# Patient Record
Sex: Female | Born: 1935 | Race: White | Hispanic: No | Marital: Married | Smoking: Never smoker
Health system: Southern US, Community
[De-identification: ages and names within clinical notes are randomized; demographics above are authoritative.]

## PROBLEM LIST (undated history)

## (undated) DIAGNOSIS — I10 Essential (primary) hypertension: Secondary | ICD-10-CM

## (undated) HISTORY — DX: Essential (primary) hypertension: I10

---

## 1997-12-24 ENCOUNTER — Other Ambulatory Visit: Admission: RE | Admit: 1997-12-24 | Discharge: 1997-12-24 | Payer: Self-pay | Admitting: *Deleted

## 1999-09-09 ENCOUNTER — Encounter: Payer: Self-pay | Admitting: *Deleted

## 1999-09-09 ENCOUNTER — Encounter: Admission: RE | Admit: 1999-09-09 | Discharge: 1999-09-09 | Payer: Self-pay | Admitting: *Deleted

## 2000-05-09 ENCOUNTER — Other Ambulatory Visit: Admission: RE | Admit: 2000-05-09 | Discharge: 2000-05-09 | Payer: Self-pay | Admitting: *Deleted

## 2001-04-04 ENCOUNTER — Encounter: Payer: Self-pay | Admitting: Internal Medicine

## 2001-04-04 ENCOUNTER — Encounter: Admission: RE | Admit: 2001-04-04 | Discharge: 2001-04-04 | Payer: Self-pay | Admitting: Internal Medicine

## 2002-03-15 ENCOUNTER — Other Ambulatory Visit: Admission: RE | Admit: 2002-03-15 | Discharge: 2002-03-15 | Payer: Self-pay | Admitting: Internal Medicine

## 2002-04-09 ENCOUNTER — Encounter: Payer: Self-pay | Admitting: Internal Medicine

## 2002-04-09 ENCOUNTER — Encounter: Admission: RE | Admit: 2002-04-09 | Discharge: 2002-04-09 | Payer: Self-pay | Admitting: Internal Medicine

## 2002-05-29 ENCOUNTER — Encounter (INDEPENDENT_AMBULATORY_CARE_PROVIDER_SITE_OTHER): Payer: Self-pay | Admitting: Specialist

## 2002-05-29 ENCOUNTER — Ambulatory Visit (HOSPITAL_COMMUNITY): Admission: RE | Admit: 2002-05-29 | Discharge: 2002-05-29 | Payer: Self-pay | Admitting: Gastroenterology

## 2003-05-01 ENCOUNTER — Encounter: Admission: RE | Admit: 2003-05-01 | Discharge: 2003-05-01 | Payer: Self-pay | Admitting: Internal Medicine

## 2003-05-01 ENCOUNTER — Encounter: Payer: Self-pay | Admitting: Internal Medicine

## 2004-05-19 ENCOUNTER — Encounter: Admission: RE | Admit: 2004-05-19 | Discharge: 2004-05-19 | Payer: Self-pay | Admitting: Internal Medicine

## 2005-06-02 ENCOUNTER — Encounter: Admission: RE | Admit: 2005-06-02 | Discharge: 2005-06-02 | Payer: Self-pay | Admitting: Internal Medicine

## 2006-11-19 ENCOUNTER — Emergency Department (HOSPITAL_COMMUNITY): Admission: EM | Admit: 2006-11-19 | Discharge: 2006-11-19 | Payer: Self-pay | Admitting: Emergency Medicine

## 2006-11-23 ENCOUNTER — Emergency Department (HOSPITAL_COMMUNITY): Admission: EM | Admit: 2006-11-23 | Discharge: 2006-11-23 | Payer: Self-pay | Admitting: Emergency Medicine

## 2007-05-09 ENCOUNTER — Encounter: Admission: RE | Admit: 2007-05-09 | Discharge: 2007-05-09 | Payer: Self-pay | Admitting: Internal Medicine

## 2009-11-05 ENCOUNTER — Emergency Department (HOSPITAL_COMMUNITY): Admission: EM | Admit: 2009-11-05 | Discharge: 2009-11-05 | Payer: Self-pay | Admitting: Emergency Medicine

## 2010-07-10 ENCOUNTER — Emergency Department (HOSPITAL_COMMUNITY): Admission: EM | Admit: 2010-07-10 | Discharge: 2010-07-11 | Payer: Self-pay | Admitting: Emergency Medicine

## 2010-09-16 ENCOUNTER — Emergency Department (HOSPITAL_COMMUNITY)
Admission: EM | Admit: 2010-09-16 | Discharge: 2010-09-16 | Payer: Self-pay | Source: Home / Self Care | Admitting: Emergency Medicine

## 2010-09-20 LAB — BASIC METABOLIC PANEL
BUN: 10 mg/dL (ref 6–23)
Chloride: 101 mEq/L (ref 96–112)
Creatinine, Ser: 0.69 mg/dL (ref 0.4–1.2)
GFR calc Af Amer: >60 mL/min (ref >60)
GFR calc non Af Amer: >60 mL/min (ref >60)
Potassium: 4.1 mEq/L (ref 3.5–5.1)

## 2010-09-20 LAB — DIFFERENTIAL
Basophils Relative: 0 % (ref 0–1)
Eosinophils Absolute: 0.1 10*3/uL (ref 0.0–0.7)
Eosinophils Relative: 1 % (ref 0–5)
Neutrophils Relative %: 73 % (ref 43–77)

## 2010-09-20 LAB — CBC
MCH: 28.3 pg (ref 26.0–34.0)
MCV: 83.5 fL (ref 78.0–100.0)
Platelets: 197 10*3/uL (ref 150–400)
RBC: 5.26 MIL/uL — ABNORMAL HIGH (ref 3.87–5.11)
RDW: 13.8 % (ref 11.5–15.5)
WBC: 7.7 10*3/uL (ref 4.0–10.5)

## 2010-11-09 LAB — CBC
Hemoglobin: 14.3 g/dL (ref 12.0–15.0)
Platelets: 185 10*3/uL (ref 150–400)
RBC: 4.99 MIL/uL (ref 3.87–5.11)
WBC: 10.1 10*3/uL (ref 4.0–10.5)

## 2010-11-09 LAB — DIFFERENTIAL
Lymphocytes Relative: 34 % (ref 12–46)
Lymphs Abs: 3.4 10*3/uL (ref 0.7–4.0)
Monocytes Relative: 6 % (ref 3–12)
Neutro Abs: 5.9 10*3/uL (ref 1.7–7.7)
Neutrophils Relative %: 58 % (ref 43–77)

## 2010-11-09 LAB — BASIC METABOLIC PANEL
CO2: 27 mEq/L (ref 19–32)
Calcium: 9.2 mg/dL (ref 8.4–10.5)
Creatinine, Ser: 0.89 mg/dL (ref 0.4–1.2)
GFR calc Af Amer: >60 mL/min (ref >60)
Sodium: 131 mEq/L — ABNORMAL LOW (ref 135–145)

## 2010-11-21 LAB — COMPREHENSIVE METABOLIC PANEL
ALT: 18 U/L (ref 0–35)
Albumin: 3.9 g/dL (ref 3.5–5.2)
BUN: 11 mg/dL (ref 6–23)
Calcium: 9.1 mg/dL (ref 8.4–10.5)
Glucose, Bld: 135 mg/dL — ABNORMAL HIGH (ref 70–99)
Sodium: 137 mEq/L (ref 135–145)
Total Protein: 7.1 g/dL (ref 6.0–8.3)

## 2010-11-21 LAB — URINALYSIS, ROUTINE W REFLEX MICROSCOPIC
Glucose, UA: NEGATIVE mg/dL
Hgb urine dipstick: NEGATIVE
Protein, ur: NEGATIVE mg/dL
Specific Gravity, Urine: 1.011 (ref 1.005–1.030)
pH: 6 (ref 5.0–8.0)

## 2010-11-21 LAB — URINE MICROSCOPIC-ADD ON

## 2010-11-21 LAB — CBC
HCT: 42.9 % (ref 36.0–46.0)
Platelets: 157 10*3/uL (ref 150–400)
RDW: 14.3 % (ref 11.5–15.5)

## 2010-11-21 LAB — DIFFERENTIAL
Lymphs Abs: 2.3 10*3/uL (ref 0.7–4.0)
Monocytes Absolute: 0.4 10*3/uL (ref 0.1–1.0)
Monocytes Relative: 6 % (ref 3–12)
Neutro Abs: 4.6 10*3/uL (ref 1.7–7.7)
Neutrophils Relative %: 61 % (ref 43–77)

## 2010-11-21 LAB — CK: Total CK: 27 U/L (ref 7–177)

## 2011-01-14 NOTE — Op Note (Signed)
   NAME:  Audrey Burke, Audrey Burke                     ACCOUNT NO.:  [id number]   MEDICAL RECORD NO.:  [id number]                   PATIENT TYPE:  AMB   LOCATION:  ENDO                                 FACILITY:  [facility]   PHYSICIAN:  Danise Edge, M.D.                DATE OF BIRTH:  [birth year]   DATE OF PROCEDURE:  05/29/2002  DATE OF DISCHARGE:                                 OPERATIVE REPORT   PROCEDURE:  Colonoscopy.   REFERRING PHYSICIAN:  Thora Lance, M.D.   PROCEDURE INDICATION:  The patient is a 77 year old female born 15-Sep-1931.  The patient is scheduled to undergo a diagnostic colonoscopy to  evaluate guaiac-positive stool.   ENDOSCOPIST:  Charolett Bumpers, M.D.   PREMEDICATION:  Versed 10 mg, Demerol 50 mg.   INSTRUMENT USED:  Olympus pediatric colonoscope.   PROCEDURE:  After obtaining informed consent, the patient was placed in the  left lateral decubitus position.  I administered intravenous Demerol and  intravenous Versed to achieve conscious sedation for the procedure.  The  patient's blood pressure, oxygen saturation, and cardiac rhythm were  monitored throughout the procedure and documented in the medical record.   Anal inspection was normal.  Digital rectal exam was normal.  The Olympus  pediatric video colonoscope was introduced into the rectum and easily  advanced to the cecum.  A normal-appearing ileocecal valve was intubated and  the distal ileum inspected.  Colonic preparation for the exam today was  excellent.   Rectum:  Normal.   Sigmoid colon and descending colon:  Normal.   Splenic flexure:  Normal.   Transverse colon:  Normal.   Hepatic flexure:  Normal.   Ascending colon:  A 3-mm sessile polyp was injected with saline and removed  with the electrocautery snare from the proximal ascending colon.  A 1-mm  sessile polyp was removed with the cold biopsy forceps from the mid  ascending colon.  Both polyps were submitted in one bottle  for pathological  evaluation.   Cecum and ileocecal valve:  Normal.   Distal ileum:  Normal.   ASSESSMENT:  From the ascending colon, a 3-mm polyp and 1-mm polyp were  removed; otherwise, normal proctocolonoscopy to the cecum.                                                  Danise Edge, M.D.    MJ/MEDQ  D:  05/29/2002  T:  05/29/2002  Job:  [id number]   cc:   Thora Lance, M.D.  [address] [place]  [place] [address]  Fax: [phone]

## 2011-04-18 ENCOUNTER — Inpatient Hospital Stay (HOSPITAL_COMMUNITY)
Admission: EM | Admit: 2011-04-18 | Discharge: 2011-04-21 | DRG: 308 | Disposition: A | Discharge status: Home Health | Payer: PRIVATE HEALTH INSURANCE | Attending: Internal Medicine | Admitting: Internal Medicine

## 2011-04-18 ENCOUNTER — Emergency Department (HOSPITAL_COMMUNITY): Payer: PRIVATE HEALTH INSURANCE

## 2011-04-18 DIAGNOSIS — R7309 Other abnormal glucose: Secondary | ICD-10-CM | POA: Diagnosis present

## 2011-04-18 DIAGNOSIS — M1990 Unspecified osteoarthritis, unspecified site: Secondary | ICD-10-CM | POA: Diagnosis present

## 2011-04-18 DIAGNOSIS — M79609 Pain in unspecified limb: Secondary | ICD-10-CM

## 2011-04-18 DIAGNOSIS — M545 Low back pain, unspecified: Secondary | ICD-10-CM | POA: Diagnosis present

## 2011-04-18 DIAGNOSIS — F411 Generalized anxiety disorder: Secondary | ICD-10-CM | POA: Diagnosis present

## 2011-04-18 DIAGNOSIS — E0590 Thyrotoxicosis, unspecified without thyrotoxic crisis or storm: Secondary | ICD-10-CM | POA: Diagnosis present

## 2011-04-18 DIAGNOSIS — E538 Deficiency of other specified B group vitamins: Secondary | ICD-10-CM | POA: Diagnosis present

## 2011-04-18 DIAGNOSIS — I509 Heart failure, unspecified: Secondary | ICD-10-CM | POA: Diagnosis present

## 2011-04-18 DIAGNOSIS — Z7901 Long term (current) use of anticoagulants: Secondary | ICD-10-CM

## 2011-04-18 DIAGNOSIS — I10 Essential (primary) hypertension: Secondary | ICD-10-CM | POA: Diagnosis present

## 2011-04-18 DIAGNOSIS — I5031 Acute diastolic (congestive) heart failure: Secondary | ICD-10-CM | POA: Diagnosis present

## 2011-04-18 DIAGNOSIS — I4891 Unspecified atrial fibrillation: Principal | ICD-10-CM | POA: Diagnosis present

## 2011-04-18 DIAGNOSIS — E669 Obesity, unspecified: Secondary | ICD-10-CM | POA: Diagnosis present

## 2011-04-18 DIAGNOSIS — G8929 Other chronic pain: Secondary | ICD-10-CM | POA: Diagnosis present

## 2011-04-18 LAB — CBC
HCT: 40.3 % (ref 36.0–46.0)
Hemoglobin: 13.1 g/dL (ref 12.0–15.0)
Hemoglobin: 13 g/dL (ref 12.0–15.0)
MCH: 26 pg (ref 26.0–34.0)
MCH: 26.1 pg (ref 26.0–34.0)
MCHC: 32.5 g/dL (ref 30.0–36.0)
MCV: 80 fL (ref 78.0–100.0)
Platelets: 164 10*3/uL (ref 150–400)
RBC: 4.99 MIL/uL (ref 3.87–5.11)
WBC: 7.1 10*3/uL (ref 4.0–10.5)

## 2011-04-18 LAB — DIFFERENTIAL
Lymphocytes Relative: 35 % (ref 12–46)
Monocytes Absolute: 0.5 10*3/uL (ref 0.1–1.0)
Monocytes Relative: 8 % (ref 3–12)
Neutro Abs: 3.1 10*3/uL (ref 1.7–7.7)

## 2011-04-18 LAB — COMPREHENSIVE METABOLIC PANEL
AST: 26 U/L (ref 0–37)
BUN: 13 mg/dL (ref 6–23)
CO2: 24 mEq/L (ref 19–32)
Chloride: 107 mEq/L (ref 96–112)
Creatinine, Ser: 0.54 mg/dL (ref 0.50–1.10)
GFR calc non Af Amer: >60 mL/min (ref >60)
Glucose, Bld: 121 mg/dL — ABNORMAL HIGH (ref 70–99)
Total Bilirubin: 0.6 mg/dL (ref 0.3–1.2)

## 2011-04-18 LAB — PHOSPHORUS: Phosphorus: 4.3 mg/dL (ref 2.3–4.6)

## 2011-04-18 LAB — CK TOTAL AND CKMB (NOT AT ARMC): Total CK: 33 U/L (ref 7–177)

## 2011-04-18 LAB — TSH: TSH: 0.011 u[IU]/mL — ABNORMAL LOW (ref 0.350–4.500)

## 2011-04-18 LAB — PROTIME-INR: Prothrombin Time: 14.5 seconds (ref 11.6–15.2)

## 2011-04-18 LAB — TROPONIN I: Troponin I: <0.3 ng/mL (ref <0.30)

## 2011-04-18 LAB — PRO B NATRIURETIC PEPTIDE: Pro B Natriuretic peptide (BNP): 1313 pg/mL — ABNORMAL HIGH (ref 0–450)

## 2011-04-19 ENCOUNTER — Other Ambulatory Visit (HOSPITAL_COMMUNITY): Payer: PRIVATE HEALTH INSURANCE

## 2011-04-19 LAB — CARDIAC PANEL(CRET KIN+CKTOT+MB+TROPI)
Relative Index: INVALID (ref 0.0–2.5)
Total CK: 29 U/L (ref 7–177)
Troponin I: <0.3 ng/mL (ref <0.30)

## 2011-04-19 LAB — BASIC METABOLIC PANEL
CO2: 28 mEq/L (ref 19–32)
Chloride: 107 mEq/L (ref 96–112)
Creatinine, Ser: 0.56 mg/dL (ref 0.50–1.10)
Glucose, Bld: 102 mg/dL — ABNORMAL HIGH (ref 70–99)

## 2011-04-19 LAB — LIPID PANEL
Cholesterol: 102 mg/dL (ref 0–200)
LDL Cholesterol: 50 mg/dL (ref 0–99)
Total CHOL/HDL Ratio: 3.4 RATIO
Triglycerides: 108 mg/dL (ref <150)
VLDL: 22 mg/dL (ref 0–40)

## 2011-04-19 LAB — PRO B NATRIURETIC PEPTIDE: Pro B Natriuretic peptide (BNP): 1516 pg/mL — ABNORMAL HIGH (ref 0–450)

## 2011-04-20 ENCOUNTER — Inpatient Hospital Stay (HOSPITAL_COMMUNITY): Payer: PRIVATE HEALTH INSURANCE

## 2011-04-20 LAB — BASIC METABOLIC PANEL
BUN: 16 mg/dL (ref 6–23)
CO2: 28 mEq/L (ref 19–32)
Glucose, Bld: 112 mg/dL — ABNORMAL HIGH (ref 70–99)
Potassium: 4.1 mEq/L (ref 3.5–5.1)
Sodium: 141 mEq/L (ref 135–145)

## 2011-04-20 LAB — THYROID ANTIBODIES: Thyroglobulin Ab: 32 U/mL (ref <40.0)

## 2011-04-21 LAB — BASIC METABOLIC PANEL
Chloride: 103 mEq/L (ref 96–112)
Creatinine, Ser: 0.63 mg/dL (ref 0.50–1.10)
GFR calc Af Amer: >60 mL/min (ref >60)
Potassium: 3.7 mEq/L (ref 3.5–5.1)
Sodium: 139 mEq/L (ref 135–145)

## 2011-04-21 LAB — CBC
HCT: 40.1 % (ref 36.0–46.0)
Hemoglobin: 13.3 g/dL (ref 12.0–15.0)
RBC: 5.02 MIL/uL (ref 3.87–5.11)
WBC: 5.9 10*3/uL (ref 4.0–10.5)

## 2011-04-21 LAB — PRO B NATRIURETIC PEPTIDE: Pro B Natriuretic peptide (BNP): 997.8 pg/mL — ABNORMAL HIGH (ref 0–450)

## 2011-04-21 LAB — PROTIME-INR: INR: 1.16 (ref 0.00–1.49)

## 2011-04-21 NOTE — Consult Note (Signed)
NAME:  Audrey Burke, Audrey [name]:  [id number]  MEDICAL RECORD NO.:  [id number]  LOCATION:                                 FACILITY:  PHYSICIAN:  Jake Bathe, MD      DATE OF BIRTH:  1932-01-08  DATE OF CONSULTATION: DATE OF DISCHARGE:                                CONSULTATION   PRIMARY CARE PHYSICIAN:  Georgann Housekeeper, MD  REASON FOR CONSULTATION:  Evaluation of newly discovered atrial fibrillation.  HISTORY OF PRESENT ILLNESS:  This is a 77 year old female with past medical history of hypertension, diet-controlled diabetes, and chronic edema in legs who was admitted from [facility] Emergency Department with a chief complaint of leg swelling.  She has had chronic edema, however, she reports that over the past 2 weeks she has noticed increasing tightness in her ankles and swelling of bilateral knees.  She had some mild dyspnea on exertion, but no chest pain or chest pressure.  An EKG was performed in the emergency room which showed atrial fibrillation. She also had lab work which demonstrated TSH of 0.011 and a free T4 elevated at 3.07 with the upper normal being 1.8.  Currently, on telemetry, her heart rate is in the upper 90s-110s, well rate controlled and atrial fibrillation.  Dr. Donette Larry had recently increased her Toprol from 25-50 mg once a day.  In talking with her, she describes no significant symptoms other than her leg swelling.  It is a bit challenging to have a conversation with her given that she speaks [place] and has [place] as a second language, however, we were able to communicate which I thought successfully.  She has had no prior history of stroke or TIA, but she does have age greater than 60 as well as hypertension.  She also has diabetes, which is diet controlled.  Her CHADS2 score is at least 2, possibly 3 with the diabetes addition.  PAST MEDICAL HISTORY: 1. Hypertension. 2. Dyslipidemia, diet controlled. 3. Diet-controlled diabetes. 4.  Anxiety. 5. Chronic edema in legs 6. Osteoarthritis.  MEDICATIONS:  At home she is taking: 1. Toprol-XL 50 mg once a day. 2. Benicar 20 mg a day. 3. Norvasc 10 mg a day. 4. Klonopin 1 mg twice a day. 5. Celebrex 20 mg a day. 6. Fish oil 1000 mg 2 tablets a day. 7. Vitamin B12 injections. 8. Nasonex and Voltaren as well as Allegra.  SURGICAL HISTORY:  She has had cataract removal of left eye with complications of infection and renal damage, followed at [facility] now.  ALLERGIES:  She cannot take GENERIC MEDICINES according to records.  SOCIAL HISTORY:  She has never smoked.  No alcohol.  No drug use.  She is a Doctor, general practice for many years.  She is married, has 2 sons.  FAMILY HISTORY:  Denies any early family history of coronary artery disease, currently noncontributory.  No significant arrhythmia history.  REVIEW OF SYSTEMS:  No fevers.  No chills.  Positive for edema.  No diarrhea.  No skin or hair changes that she has noted.  Unless specified above, all other 12 review of systems negative.  PHYSICAL EXAMINATION:  VITAL SIGNS:  Heart rate ranging between 90-110, blood  pressure 124/81, saturating 93% on room air, and temperature 98.3. Telemetry reviewed notes atrial fibrillation as above. GENERAL:  Very pleasant, in no acute distress, just having washed her hand at the sink, sitting in bed, comfortable without any significant dyspnea. EYES:  Well-perfused conjunctivae.  EOMI.  No scleral icterus. NECK:  Supple.  No lymphadenopathy.  No thyromegaly.  No carotid bruits. No JVD appreciated (note that her thyroid function was elevated). HEAD:  Normocephalic and atraumatic. CARDIOVASCULAR:  Irregularly irregular rhythm with soft systolic murmur heard at the right upper sternal border.  Normal PMI. LUNGS:  Clear to auscultation bilaterally.  Normal respiratory effort. No crackles heard. ABDOMEN:  Soft, nontender, normoactive bowel sounds, overweight.   No bruits. EXTREMITIES:  Edema noted bilateral lower extremities.  Mild pitting noted.  No erythema.  Palpable distal pulses. SKIN:  Dry and intact and warm.  No erythema. GU:  Deferred. RECTAL:  Deferred. NEURO:  Nonfocal.  No tremors are noted.  EKG personally reviewed demonstrates atrial fibrillation with tachycardia.  No ST-segment changes noted.  Chest x-ray also personally reviewed showed moderate cardiomegaly, diffuse interstitial airway disease, possible pulmonary edema.  Echocardiogram personally reviewed shows normal ejection fraction, normal left ventricular cavity size, and small pericardial effusion with no evidence of tamponade electrocardiographically.  Free T4 is elevated.  Cardiac panel is normal.  LDL is 50, HDL is 30.  BNP is 1516.  Hemoglobin A1c 5.8. Creatinine is 0.54, albumin is 3.4, otherwise LFTs are normal. Potassium is 3.9.  ASSESSMENT/PLAN:  This is a 77 year old female with English as a second language with newly discovered atrial fibrillation well rate controlled with hyperthyroidism, hypertension, and diet-controlled diabetes. 1. Atrial fibrillation - I agree with current increase in Toprol to 50     mg once a day.  Her rate control is quite adequate, although she     may need further increased to 75 or perhaps even 100.  Blood     pressure seems to be able to tolerate this.  Most likely, etiology     behind the atrial fibrillation is her hyperthyroidism and hopefully     after her hyperthyroidism is better controlled with methimazole her     atrial fibrillation will also be better controlled.  I would not     advocate at this point cardioversion given her hyperthyroid status.     I completely agree with beta-blocker in this situation.  This often     seems to be more affective in hyperthyroid state. 2. In regards to Coumadin, the question was posed to me whether or not     she would be a Coumadin candidate.  Other than her communication     issues,  given that English not as robust as her native language, I     do believe that given her CHADS2 score of 2 or possibly 3 if you     include the diet-controlled diabetes that she is at increased risk     for stroke without Coumadin anticoagulation.  For now, I would     advocate use of Coumadin unless she proves that she is unable to     medically comply with this.  Pradaxa or Xarelto could be possible     alternatives, however, given her hyperthyroid state I would rather     use Coumadin which we have more experience with currently. 3. Hypertension - currently stable per Dr. Donette Larry. 4. Acute diastolic heart failure - her BNP is elevated.  Her lower  extremity edema is worse.  I agree completely with utilization of     Lasix as is being prescribed currently.  Replete potassium as     necessary. 5. Chronic anticoagulation - as discussion above.  I would advocate     chronic anticoagulation in her.     Jake Bathe, MD     MCS/MEDQ  D:  04/19/2011  T:  04/20/2011  Job:  [id number]  cc:   Georgann Housekeeper, MD  Electronically Signed by Donato Schultz MD on 04/21/2011 06:34:30 AM

## 2011-05-06 NOTE — Discharge Summary (Signed)
NAMEMarland Burke  CAMIL, HAUSMANN NO.:  [id number]  MEDICAL RECORD NO.:  [id number]  LOCATION:  3734                         FACILITY:  [facility]  PHYSICIAN:  Georgann Housekeeper, MD      DATE OF BIRTH:  02-Mar-1932  DATE OF ADMISSION:  04/18/2011 DATE OF DISCHARGE:  04/21/2011                              DISCHARGE SUMMARY   DISCHARGE DIAGNOSES: 1. New onset atrial fibrillation. 2. Congestive heart failure/volume overload, rate related. 3. Hyperthyroidism with ultrasound suggestive of acute thyroiditis. 4. History of anxiety. 5. History of hypertension. 6. History of mildly elevated blood sugar, A1c normal. 7. History of osteoarthritis of the knee. 8. History of B12 deficiency.  MEDICATION ON DISCHARGE: 1. Methimazole 5 mg tablets 3 times a day (Tapazole). 2. Metoprolol succinate 50 mg 1-1/2 tablet daily. 3. Potassium 20 mEq daily. 4. Warfarin 5 mg as directed. 5. Lasix 40 mg p.o. daily. 6. Vitamin B12 injections once a month as before. 7. Benicar 20 mg daily. 8. Celebrex 200 mg 1 capsule daily. 9. Klonopin 1 mg 1 tablet twice a day.  LABORATORY DATA:  INR was 1.16.  Blood chemistries:  Potassium was 3.7, creatinine 0.6, sodium 139, glucose 127.  Blood counts:  White count of 5.9, hemoglobin 13.3, platelets 157,000.  Cardiac markers x3 negative. BNP maximum was 1516.  At the time of discharge, it was 997.  The thyroid antibody test showed thyroid peroxidase elevated at 699 and thyroglobulin antibodies were normal.  TSH of 0.01 and free T4 of 3.8. Ultrasound of the thyroid showed no discrete nodules, heterogeneous, consistent with thyroiditis.  Chest x-ray consistent with CHF.  A 2-D echocardiogram showed normal EF with normal valves and heart function. As far as her vitals remained stable with the blood pressure 125/73, heart rate in the 80s-90s, sats 92-94% on room air.  CONSULTATION:  Cardiology.  PROCEDURES:  A 2-D echocardiogram and also bilateral lower  extremity Dopplers negative for DVT.  HOSPITAL COURSE:  A 77 year old [place] immigrant who I know her for many years presented to the emergency room with some shortness of breath and atypical chest pain.  The patient was found to have new onset atrial fibrillation with a heart rate in the 100-120s with mild fluid overload. 1. Cardiac:  The patient was admitted to telemetry.  She ruled out for     an MI.  Cardiac markers negative.  Cardiology was consulted.  She     had a 2-D echocardiogram which showed normal LV function and normal     EF with normal valves.  She was aggressively diuresed with IV Lasix     40 mg twice a day with good results.  Her leg edema and fluid     overload much improved.  Her BNP was improved, clinically felt     better.  She was started on Coumadin for her atrial fibrillation.     We will monitor that outpatient with the therapeutic dose, INR     between 2 and 3.  Coumadin teaching will be done before discharge.     Her aspirin will be discontinued.  The patient's atrial     fibrillation was thought to be mostly related to her hyperthyroid  which we found on the blood work. 2. Hyperthyroidism.  TSH of 0.01 and free T4 of 3.8 consistent with     hyperthyroid.  She had ultrasound of the thyroid done which showed     no evidence of any nodules, diffuse heterogeneous thyroid gland     consistent with thyroiditis.  Differential of this includes     Hashimoto's thyroiditis, also Graves disease.  Her thyroid     peroxidase antibodies were positive with 699.  Thyroid globulin     antibody was normal.  The patient was started on Tapazole 5 mg     t.i.d.  We will repeat the blood work in 1 month at the office.     She might need a radioactive uptake scan and follow up with     Endocrinology Outpatient.  The patient was put on beta-blocker for     the heart rate, 75 mg and will continue that. 3. For her CHF volume overload, the patient was switched to p.o. Lasix     40  mg along with 20 mEq of potassium and will continue that and     follow up outpatient. 4. History of hypertension.  Continue on Benicar.  Her Norvasc was     discontinued. 5. B12 deficiency.  Continue on B12 injections as previous. 6. History of osteoarthritis.  Continue on Celebrex 200 mg daily.  The patient was discharged home with home health and follow up with the Coumadin check at the office and follow up with me in 1 week.  Discharge planning time taken over 30 minutes.     Georgann Housekeeper, MD     KH/MEDQ  D:  04/21/2011  T:  04/21/2011  Job:  [id number]  cc:   Jake Bathe, MD  Electronically Signed by Georgann Housekeeper MD on 05/06/2011 08:56:10 PM

## 2011-05-26 NOTE — H&P (Signed)
NAMEDRUCELLA, KARBOWSKI NO.:  [id number]  MEDICAL RECORD NO.:  [id number]  LOCATION:  [facility]                         FACILITY:  [facility]  PHYSICIAN:  Lonia Blood, M.D.DATE OF BIRTH:  1931/10/19  DATE OF ADMISSION:  04/18/2011 DATE OF DISCHARGE:                             HISTORY & PHYSICAL   PRIMARY CARE PHYSICIAN:  Georgann Housekeeper, MD  CHIEF COMPLAINT:  Leg swelling.  HISTORY OF PRESENT ILLNESS:  Ms. Audrey Burke is a very pleasant [place] immigrant who speaks English on a limited basis as a second language.  This makes the history difficult, but she does speak enough to be able to provide a reliable account of her current symptoms.  She reports that approximately 2 weeks ago, she began to notice significant tightness in her ankles.  This has progressed to the point of her having severe swelling all the way up to both knees.  She has not noted any palpitations.  She did report some dyspnea on exertion, but no substernal chest pain or chest pressure.  She complains of achiness in both of her knees, but this appears to be more of a chronic issue.  She has not seen Dr. Donette Larry about this issue before.  She has no prior history of lower extremity edema.  She has not been on any recent long distance trips.  She does not smoke and she is not on any medications that are traditionally known to increase ones risk for blood clots.  She has had no erythema of the legs.  Her legs have simply become edematous enough that it is difficult for her to get around and as a result she presented to the ER for evaluation today.  Upon arriving in the emergency room, the patient was also found be in AFib with a heart rate of 124.  With negative chromotrope, the patient's heart rate has spontaneously settled in the 105 range.  Her blood pressure has been consistently elevated, though not emergently so.  Her chest x-ray is consistent with pulmonary edema and a venous Doppler has  ruled out a left lower extremity DVT.  Pro BNP was noted be markedly elevated at 1313.  REVIEW OF SYSTEMS:  Ms. Audrey Burke appears to be quite fixated with an episode of diarrhea, nausea and vomiting that she had back in June.  It does not appear that her current symptoms are in any way related to this.  She however appears to think that "things have been the same since then."  A comprehensive review of systems is otherwise unremarkable.  PAST MEDICAL HISTORY: 1. Hypertension. 2. Chronic low back pain. 3. Anxiety disorder. 4. History of "eye surgery."  OUTPATIENT MEDICATIONS: 1. Norvasc 10 mg daily. 2. Klonopin 1 mg b.i.d. 3. Felodipine listed as 20 mg b.i.d. 4. Celebrex 200 mg daily. 5. Benicar 20 mg daily.  ALLERGIES:  AMOXICILLIN, the patient reports that "I have had trouble with a lot of medicines in the past" - it sounds, however that these were intolerances rather than true anaphylactic-type allergies.  FAMILY HISTORY:  Reviewed with the patient, but noncontributory to this admission.  SOCIAL HISTORY:  The patient speaks primarily [place], but does speak broken [place] and is able to communicate  effectively though it does take quite some time.  She does not smoke or drink significant amounts of alcohol.  She is married and is accompanied by her husband of 54 years.  She has two grown children, both of them are boys who are healthy.  DATA REVIEW:  CBC is unremarkable.  BMET is remarkable only for hyperglycemia with a serum glucose of 121.  LFTs are unremarkable. Albumin is 3.4.  Troponin is zero.  Pro BNP is 1313.  A 12-lead EKG does in fact appear to confirm atrial fibrillation with a captured ventricular response rate of 123 beats per minute with no acute ST or T- wave changes.  Chest x-ray reveals diffuse pulmonary edema, bilateral small effusions, and associated passive atelectasis in bilateral lower lobes.  Venous duplex Doppler of the left lower  extremity is negative for evidence of DVT.  PHYSICAL EXAMINATION:  VITAL SIGNS:  Temperature 97.7, blood pressure 160/80, heart rate 124, respiratory rate 16, O2 sat is 93% on room air with heart rate presently improved to 105 and blood pressure of 149/81. GENERAL:  Well-developed overweight female in no acute respiratory distress. HEENT:  Normocephalic, atraumatic.  Pupils are equal, round, reactive to light and accommodation.  Extraocular muscles are intact bilaterally. OC- OP clear. NECK:  JVD to the angle of the jaw at 30 degrees. LUNGS:  Mild faint bibasilar crackles with good air movement throughout other fields with no active wheeze. CARDIOVASCULAR:  Irregularly irregular without gallop or rub with normal S1 and S2. ABDOMEN:  Overweight, soft.  Bowel sounds present.  No organomegaly.  No rebound or ascites. EXTREMITIES:  The patient exhibits 3+ bilateral lower extremity edema from the knees down.  There does not appear to be severe edema above the knees.  There is no cellulitic type change/erythema and there is no cyanosis or clubbing. NEUROLOGIC:  The patient is alert and oriented x4.  Cranial nerves II through XII are intact bilaterally.  She displays 5/5 strength in bilateral upper and lower extremities.  She is intact to sensation of touch throughout.  IMPRESSION AND PLAN: 1. Newly diagnosed atrial fibrillation - there is no evidence if the     patient has been previously noted to be in atrial fibrillation.     Her EKG does in fact suggest a true atrial fibrillation.  She     appears to be tolerating it well with exception to her significant     edema/volume overload.  She is not hypotensive and is not suffering     with severe respiratory symptoms.  She has not felt any     palpitations.  It is entirely unclear how long that she has been in     atrial fibrillation.  She does have a CHADS-VASc score of 5.  As a     result, I do feel it is important to fully  anticoagulate her.  I     will initiate Lovenox therapy for now.  We will need to transition     her either to Coumadin or a Pradaxa like drug during her hospital     stay.  I will leave this to her primary care physician who will be     seeing her in followup tomorrow to determine what would be most     appropriate for this patient.  We will dose her with a beta-     blocker.  We will follow her on telemetry.  I will cycle cardiac     enzymes as  a precaution.  We will obtain a transthoracic     echocardiogram.  There is no indication at the present time for an     acute cardioversion or other antiarrhythmic drugs. 2. Volume overload - the patient does in fact have impressive lower     extremity edema.  This could truly be an indication of diminished     LV function.  It could also however be the sequelae of calcium-     channel blocker use as well as poor cardiac output due to atrial     fibrillation rather than true left ventricular dysfunction.  For     now, she clearly does need diuresis.  I will place her on Lasix at     40 mg IV on a q.12-hour basis.  We will empirically treat her with     potassium to attempt to avoid hypokalemia.  She is on an ARB and     therefore I will continue this medicine.  I will add a beta-blocker  as the patient does not appear to be in extremis and therefore it     is felt that a beta-blocker is safe.  An echo will be accomplished     as discussed above.  We will adhere to a strict fluid restriction     and a low-sodium diet while we investigate her CHF. 3. Hyperglycemia - the patient has no known prior history of diabetes.     It is quite possible this is simply a stress-related reaction.  We     will check a hemoglobin A1c and pursue this further as indicated. 4. Uncontrolled hypertension - this may very well be complicated by     her AFib as well as her significant edema/heart failure.  We will     diurese her.  We will hold her calcium-channel  blockers (both the     felodipine and the Norvasc) due to her severe lower extremity edema     and her acute heart failure.  We will determine if other agents are     needed on top of the beta-blocker and ACE inhibitor and diuretic     during her hospital stay. 5. Anxiety disorder - we will continue her Klonopin as is dosed at     home. 6. Chronic low back pain - the patient is on Celebrex at home.  This     has also been implicated in significant lower extremity edema.  For     now, I will hold her Celebrex and we will dose her with Tylenol and     oxycodone as needed.  It is quite possible, however that Celebrex     could be restarted prior to her discharge depending upon the agents     that are used for anticoagulation and the status of her edema at     the time of discharge.  Please note this is a patient of Dr. Georgann Housekeeper.  She is being admitted by the triad hospitalist to the service of Dr. Donette Larry and her care will be assumed by Dr. Donette Larry as per the usual protocol.     Lonia Blood, M.D.     JTM/MEDQ  D:  04/18/2011  T:  04/18/2011  Job:  [id number]  cc:   Georgann Housekeeper, MD  Electronically Signed by Jetty Duhamel M.D. on 05/26/2011 09:47:22 AM

## 2011-06-03 ENCOUNTER — Emergency Department (HOSPITAL_COMMUNITY)
Admission: EM | Admit: 2011-06-03 | Discharge: 2011-06-03 | Disposition: A | Discharge status: Home / Self Care | Payer: PRIVATE HEALTH INSURANCE | Source: Home / Self Care | Attending: Emergency Medicine | Admitting: Emergency Medicine

## 2011-06-03 ENCOUNTER — Emergency Department (HOSPITAL_COMMUNITY): Payer: PRIVATE HEALTH INSURANCE

## 2011-06-03 LAB — POCT I-STAT, CHEM 8
BUN: 17 mg/dL (ref 6–23)
Chloride: 97 mEq/L (ref 96–112)
Potassium: 4.7 mEq/L (ref 3.5–5.1)
Sodium: 126 mEq/L — ABNORMAL LOW (ref 135–145)
TCO2: 22 mmol/L (ref 0–100)

## 2011-06-03 LAB — POCT I-STAT TROPONIN I: Troponin i, poc: 0 ng/mL (ref 0.00–0.08)

## 2011-06-03 LAB — CBC
Hemoglobin: 15.4 g/dL — ABNORMAL HIGH (ref 12.0–15.0)
MCHC: 35.4 g/dL (ref 30.0–36.0)

## 2011-06-03 LAB — PROTIME-INR
INR: 1.32 (ref 0.00–1.49)
Prothrombin Time: 16.6 seconds — ABNORMAL HIGH (ref 11.6–15.2)

## 2011-06-05 ENCOUNTER — Inpatient Hospital Stay (HOSPITAL_COMMUNITY): Payer: PRIVATE HEALTH INSURANCE

## 2011-06-05 ENCOUNTER — Inpatient Hospital Stay (HOSPITAL_COMMUNITY)
Admission: EM | Admit: 2011-06-05 | Discharge: 2011-06-07 | DRG: 309 | Disposition: A | Discharge status: Home Health | Payer: PRIVATE HEALTH INSURANCE | Attending: Internal Medicine | Admitting: Internal Medicine

## 2011-06-05 ENCOUNTER — Emergency Department (HOSPITAL_COMMUNITY): Payer: PRIVATE HEALTH INSURANCE

## 2011-06-05 DIAGNOSIS — F341 Dysthymic disorder: Secondary | ICD-10-CM | POA: Diagnosis present

## 2011-06-05 DIAGNOSIS — Z881 Allergy status to other antibiotic agents status: Secondary | ICD-10-CM

## 2011-06-05 DIAGNOSIS — I10 Essential (primary) hypertension: Secondary | ICD-10-CM | POA: Diagnosis present

## 2011-06-05 DIAGNOSIS — I509 Heart failure, unspecified: Secondary | ICD-10-CM | POA: Diagnosis present

## 2011-06-05 DIAGNOSIS — I4891 Unspecified atrial fibrillation: Principal | ICD-10-CM | POA: Diagnosis present

## 2011-06-05 DIAGNOSIS — E871 Hypo-osmolality and hyponatremia: Secondary | ICD-10-CM | POA: Diagnosis present

## 2011-06-05 DIAGNOSIS — M47812 Spondylosis without myelopathy or radiculopathy, cervical region: Secondary | ICD-10-CM | POA: Diagnosis present

## 2011-06-05 DIAGNOSIS — I5032 Chronic diastolic (congestive) heart failure: Secondary | ICD-10-CM | POA: Diagnosis present

## 2011-06-05 DIAGNOSIS — Z79899 Other long term (current) drug therapy: Secondary | ICD-10-CM

## 2011-06-05 DIAGNOSIS — M19019 Primary osteoarthritis, unspecified shoulder: Secondary | ICD-10-CM | POA: Diagnosis present

## 2011-06-05 DIAGNOSIS — Z7901 Long term (current) use of anticoagulants: Secondary | ICD-10-CM

## 2011-06-05 DIAGNOSIS — E039 Hypothyroidism, unspecified: Secondary | ICD-10-CM | POA: Diagnosis present

## 2011-06-05 LAB — DIFFERENTIAL
Basophils Absolute: 0 10*3/uL (ref 0.0–0.1)
Basophils Relative: 0 % (ref 0–1)
Eosinophils Absolute: 0 10*3/uL (ref 0.0–0.7)
Eosinophils Relative: 1 % (ref 0–5)
Lymphocytes Relative: 25 % (ref 12–46)
Lymphs Abs: 1.9 10*3/uL (ref 0.7–4.0)
Monocytes Absolute: 0.4 10*3/uL (ref 0.1–1.0)
Monocytes Relative: 6 % (ref 3–12)
Neutro Abs: 5 10*3/uL (ref 1.7–7.7)
Neutrophils Relative %: 68 % (ref 43–77)

## 2011-06-05 LAB — POCT I-STAT TROPONIN I: Troponin i, poc: 0 ng/mL (ref 0.00–0.08)

## 2011-06-05 LAB — BASIC METABOLIC PANEL
BUN: 17 mg/dL (ref 6–23)
CO2: 22 mEq/L (ref 19–32)
Calcium: 9.1 mg/dL (ref 8.4–10.5)
Chloride: 91 mEq/L — ABNORMAL LOW (ref 96–112)
Creatinine, Ser: 0.82 mg/dL (ref 0.50–1.10)
GFR calc Af Amer: 78 mL/min — ABNORMAL LOW (ref >90)
GFR calc non Af Amer: 67 mL/min — ABNORMAL LOW (ref >90)
Glucose, Bld: 147 mg/dL — ABNORMAL HIGH (ref 70–99)
Potassium: 4.2 mEq/L (ref 3.5–5.1)
Sodium: 124 mEq/L — ABNORMAL LOW (ref 135–145)

## 2011-06-05 LAB — TSH: TSH: 1.724 u[IU]/mL (ref 0.350–4.500)

## 2011-06-05 LAB — CBC
HCT: 43.4 % (ref 36.0–46.0)
Hemoglobin: 15.3 g/dL — ABNORMAL HIGH (ref 12.0–15.0)
MCH: 26.3 pg (ref 26.0–34.0)
MCHC: 35.3 g/dL (ref 30.0–36.0)
MCV: 74.7 fL — ABNORMAL LOW (ref 78.0–100.0)
Platelets: 182 10*3/uL (ref 150–400)
RBC: 5.81 MIL/uL — ABNORMAL HIGH (ref 3.87–5.11)
RDW: 15.4 % (ref 11.5–15.5)
WBC: 7.4 10*3/uL (ref 4.0–10.5)

## 2011-06-05 LAB — CARDIAC PANEL(CRET KIN+CKTOT+MB+TROPI)
CK, MB: 2.8 ng/mL (ref 0.3–4.0)
CK, MB: 2.8 ng/mL (ref 0.3–4.0)
Relative Index: INVALID (ref 0.0–2.5)
Total CK: 31 U/L (ref 7–177)
Troponin I: <0.3 ng/mL (ref <0.30)

## 2011-06-05 LAB — PROTIME-INR
INR: 1.13 (ref 0.00–1.49)
Prothrombin Time: 14.7 seconds (ref 11.6–15.2)

## 2011-06-05 LAB — PHOSPHORUS: Phosphorus: 3.1 mg/dL (ref 2.3–4.6)

## 2011-06-05 LAB — T4, FREE: Free T4: 1.16 ng/dL (ref 0.80–1.80)

## 2011-06-06 ENCOUNTER — Inpatient Hospital Stay (HOSPITAL_COMMUNITY): Payer: PRIVATE HEALTH INSURANCE

## 2011-06-06 LAB — CBC
HCT: 41.3 % (ref 36.0–46.0)
MCHC: 34.9 g/dL (ref 30.0–36.0)
MCV: 75.9 fL — ABNORMAL LOW (ref 78.0–100.0)
Platelets: 192 10*3/uL (ref 150–400)
RDW: 15.6 % — ABNORMAL HIGH (ref 11.5–15.5)

## 2011-06-06 LAB — CARDIAC PANEL(CRET KIN+CKTOT+MB+TROPI)
CK, MB: 2.9 ng/mL (ref 0.3–4.0)
Total CK: 32 U/L (ref 7–177)
Troponin I: <0.3 ng/mL (ref <0.30)

## 2011-06-06 LAB — COMPREHENSIVE METABOLIC PANEL
BUN: 12 mg/dL (ref 6–23)
CO2: 24 mEq/L (ref 19–32)
Calcium: 8.9 mg/dL (ref 8.4–10.5)
Creatinine, Ser: 0.83 mg/dL (ref 0.50–1.10)
GFR calc Af Amer: 77 mL/min — ABNORMAL LOW (ref >90)
GFR calc non Af Amer: 66 mL/min — ABNORMAL LOW (ref >90)
Glucose, Bld: 120 mg/dL — ABNORMAL HIGH (ref 70–99)

## 2011-06-07 LAB — PROTIME-INR
INR: 1.79 — ABNORMAL HIGH (ref 0.00–1.49)
Prothrombin Time: 21.1 seconds — ABNORMAL HIGH (ref 11.6–15.2)

## 2011-06-10 NOTE — Discharge Summary (Signed)
Audrey Burke Kitchen  QUANTINA, Audrey [name]:  [id number]  MEDICAL RECORD NO.:  [id number]  LOCATION:  2031                         FACILITY:  [facility]  PHYSICIAN:  Georgann Housekeeper, MD      DATE OF BIRTH:  01-31-1932  DATE OF ADMISSION:  06/05/2011 DATE OF DISCHARGE:  06/07/2011                              DISCHARGE SUMMARY   DISCHARGE DIAGNOSES: 1. Atrial fibrillation, rapid ventricle response. 2. Neck and shoulder pain, osteoarthritis. 3. Depression and anxiety. 4. History of hypothyroidism. 5. Hypertension. 6. Subtherapeutic Coumadin. 7. Hyponatremia due to diuretic.  MEDICATIONS ON DISCHARGE: 1. Diltiazem 240 mg 1 tablet daily. 2. Lexapro 5 mg daily. 3. Methimazole 5 mg p.o. t.i.d. 4. Oxycodone 5 mg 1 q.8 hours p.r.n. for pain. 5. Benicar 20 mg 1 tablet in the p.m. 6. Toprol-XL 50 mg 1 daily. 7. Coumadin 5 mg half a tablet daily as directed. 8. Klonopin 1 mg b.i.d.  LABORATORY DATA:  Blood counts:  Hemoglobin 14.4, white count of 10.1. Sodium 124, initially at time of discharge 128, creatinine 0.83, potassium 4.2.  Cardiac markers x3.  Troponin and CPK negative.  LFTs normal.  Albumin 3.7, calcium 8.9.  Magnesium phosphorus normal.  T4 1.16.  TSH 1.72.  Chest x-ray negative.  CT scan of the head negative. X-ray of the cervical spine, mild arthritis.  X-ray of the left shoulder showed osteoarthritis, otherwise negative.  HOSPITAL COURSE:  A 77 year old female with a [place] origin.  She understands some English, admitted with neck and back pain, feeling bad, was found to have AFib with rapid ventricle response with the known history of AFib. 1. Cardiac.  The patient was admitted to telemetry.  She has heart     rate in the 120s.  She did not got started with a diltiazem drip.     She was continued on her Lopressor.  Cardizem was added p.o.  Heart     rate maintained in the 60s and 70s with good rate control.  She was     on anticoagulation at home but she has stopped  taking her Coumadin.     Her INR was subtherapeutic.  She was started on full-dose Lovenox     and started on Coumadin.  Her INR at time of discharge was 1.79.     We will maintain outpatient Coumadin and follow up.  Lovenox was     discontinued. 2. Hypertension.  The patient had elevated blood pressure in the     outpatient.  She was continued on her Benicar, diltiazem, and the     beta-blocker Lopressor in place of Toprol in the hospital.  Blood     pressure remained in the 120s systolic. 3. History of anxiety and depression.  The patient was started on     Lexapro 5 mg in the hospital. Continue on Klonopin 1 mg b.i.d. 4. Neck and shoulder pain.  She had x-rays of the neck and shoulder     done which showed osteoarthritis.  She was taking Celebrex at home.     It was not helping her.  She was put on oxycodone for pain.  She     also received IV morphine p.r.n. in the hospital.  The patient will     be discharged on oxycodone 5 mg q.8 hours p.r.n. for pain. 5. Hypothyroidism.  The patient's thyroid levels were in the     therapeutic range.  The patient will continue on the methimazole as     per before dose and follow up with Endocrinology. She was     clinically euthyroid. 6. Hyponatremia.  The patient was on diuretics at home.  Her Lasix was     discontinued and her sodium went up to 128.  She was asymptomatic.     We will follow up outpatient blood chemistries as far as her follow     up with home health and physical therapy as well as Coumadin,     PT/INR checked on June 09, 2011 at home, to maintain the INR     between goal of 2-3. 7. She also has history of osteoarthritis of the knee as well as the     shoulder and the neck.  Physical therapy was consulted, maintained     therapy at home will be beneficial.  We will continue oxycodone for     pain p.r.n. and Tylenol.  Celebrex was discontinued.  I have talked     with her and the medication will be reviewed with the husband  by     the nurse as well as for the clarification.  Prescriptions were     given in the hospital.  Follow up in 1 week with me at the office.     Home health and physical therapy at home.  I also have discussed     the case with her son Yaakov Guthrie and the total time for discharge taken     over 30 minutes.     Georgann Housekeeper, MD     KH/MEDQ  D:  06/07/2011  T:  06/07/2011  Job:  [id number]  Electronically Signed by Georgann Housekeeper MD on 06/10/2011 09:22:46 PM

## 2011-06-14 ENCOUNTER — Inpatient Hospital Stay (HOSPITAL_COMMUNITY)
Admission: EM | Admit: 2011-06-14 | Discharge: 2011-06-16 | DRG: 641 | Disposition: A | Discharge status: Home Health | Payer: PRIVATE HEALTH INSURANCE | Attending: Internal Medicine | Admitting: Internal Medicine

## 2011-06-14 DIAGNOSIS — F341 Dysthymic disorder: Secondary | ICD-10-CM | POA: Diagnosis present

## 2011-06-14 DIAGNOSIS — M6281 Muscle weakness (generalized): Secondary | ICD-10-CM | POA: Diagnosis present

## 2011-06-14 DIAGNOSIS — I5032 Chronic diastolic (congestive) heart failure: Secondary | ICD-10-CM | POA: Diagnosis present

## 2011-06-14 DIAGNOSIS — T43205A Adverse effect of unspecified antidepressants, initial encounter: Secondary | ICD-10-CM | POA: Diagnosis present

## 2011-06-14 DIAGNOSIS — I10 Essential (primary) hypertension: Secondary | ICD-10-CM | POA: Diagnosis present

## 2011-06-14 DIAGNOSIS — G8929 Other chronic pain: Secondary | ICD-10-CM | POA: Diagnosis present

## 2011-06-14 DIAGNOSIS — E871 Hypo-osmolality and hyponatremia: Principal | ICD-10-CM | POA: Diagnosis present

## 2011-06-14 DIAGNOSIS — Z79899 Other long term (current) drug therapy: Secondary | ICD-10-CM

## 2011-06-14 DIAGNOSIS — M1990 Unspecified osteoarthritis, unspecified site: Secondary | ICD-10-CM | POA: Diagnosis present

## 2011-06-14 DIAGNOSIS — Y92009 Unspecified place in unspecified non-institutional (private) residence as the place of occurrence of the external cause: Secondary | ICD-10-CM

## 2011-06-14 DIAGNOSIS — E0590 Thyrotoxicosis, unspecified without thyrotoxic crisis or storm: Secondary | ICD-10-CM | POA: Diagnosis present

## 2011-06-14 DIAGNOSIS — I509 Heart failure, unspecified: Secondary | ICD-10-CM | POA: Diagnosis present

## 2011-06-14 DIAGNOSIS — I4891 Unspecified atrial fibrillation: Secondary | ICD-10-CM | POA: Diagnosis present

## 2011-06-14 DIAGNOSIS — Z7901 Long term (current) use of anticoagulants: Secondary | ICD-10-CM

## 2011-06-14 DIAGNOSIS — N390 Urinary tract infection, site not specified: Secondary | ICD-10-CM | POA: Diagnosis present

## 2011-06-14 LAB — CBC
MCH: 26 pg (ref 26.0–34.0)
MCV: 75.4 fL — ABNORMAL LOW (ref 78.0–100.0)
Platelets: 188 10*3/uL (ref 150–400)
RDW: 16.4 % — ABNORMAL HIGH (ref 11.5–15.5)
WBC: 10.3 10*3/uL (ref 4.0–10.5)

## 2011-06-14 LAB — URINE MICROSCOPIC-ADD ON

## 2011-06-14 LAB — COMPREHENSIVE METABOLIC PANEL
CO2: 23 mEq/L (ref 19–32)
Calcium: 9.2 mg/dL (ref 8.4–10.5)
Creatinine, Ser: 0.74 mg/dL (ref 0.50–1.10)
GFR calc Af Amer: >90 mL/min (ref >90)
GFR calc non Af Amer: 80 mL/min — ABNORMAL LOW (ref >90)
Glucose, Bld: 122 mg/dL — ABNORMAL HIGH (ref 70–99)

## 2011-06-14 LAB — DIFFERENTIAL
Eosinophils Absolute: 0.1 10*3/uL (ref 0.0–0.7)
Eosinophils Relative: 1 % (ref 0–5)
Lymphs Abs: 2.9 10*3/uL (ref 0.7–4.0)

## 2011-06-14 LAB — URINALYSIS, ROUTINE W REFLEX MICROSCOPIC
Protein, ur: NEGATIVE mg/dL
Urobilinogen, UA: 1 mg/dL (ref 0.0–1.0)

## 2011-06-14 LAB — APTT: aPTT: 33 seconds (ref 24–37)

## 2011-06-14 LAB — POCT I-STAT TROPONIN I

## 2011-06-15 LAB — COMPREHENSIVE METABOLIC PANEL
ALT: 15 U/L (ref 0–35)
AST: 14 U/L (ref 0–37)
Alkaline Phosphatase: 96 U/L (ref 39–117)
CO2: 21 mEq/L (ref 19–32)
Chloride: 97 mEq/L (ref 96–112)
Creatinine, Ser: 0.71 mg/dL (ref 0.50–1.10)
GFR calc non Af Amer: 81 mL/min — ABNORMAL LOW (ref >90)
Potassium: 4.8 mEq/L (ref 3.5–5.1)
Total Bilirubin: 0.7 mg/dL (ref 0.3–1.2)

## 2011-06-15 LAB — PROTIME-INR: INR: 1.72 — ABNORMAL HIGH (ref 0.00–1.49)

## 2011-06-15 LAB — CK TOTAL AND CKMB (NOT AT ARMC): Relative Index: INVALID (ref 0.0–2.5)

## 2011-06-15 LAB — CBC
HCT: 43.2 % (ref 36.0–46.0)
MCHC: 34.7 g/dL (ref 30.0–36.0)
RDW: 16.7 % — ABNORMAL HIGH (ref 11.5–15.5)

## 2011-06-15 LAB — CARDIAC PANEL(CRET KIN+CKTOT+MB+TROPI)
CK, MB: 2.3 ng/mL (ref 0.3–4.0)
Relative Index: INVALID (ref 0.0–2.5)
Relative Index: INVALID (ref 0.0–2.5)
Total CK: 19 U/L (ref 7–177)
Troponin I: <0.3 ng/mL (ref <0.30)

## 2011-06-15 LAB — SODIUM, URINE, RANDOM: Sodium, Ur: 29 mEq/L

## 2011-06-15 LAB — PRO B NATRIURETIC PEPTIDE: Pro B Natriuretic peptide (BNP): 389.5 pg/mL (ref 0–450)

## 2011-06-15 LAB — LIPID PANEL
LDL Cholesterol: 104 mg/dL — ABNORMAL HIGH (ref 0–99)
Triglycerides: 69 mg/dL (ref <150)

## 2011-06-15 LAB — TROPONIN I: Troponin I: <0.3 ng/mL (ref <0.30)

## 2011-06-15 LAB — TSH: TSH: 2.471 u[IU]/mL (ref 0.350–4.500)

## 2011-06-16 LAB — BASIC METABOLIC PANEL
BUN: 17 mg/dL (ref 6–23)
CO2: 23 mEq/L (ref 19–32)
Calcium: 8.7 mg/dL (ref 8.4–10.5)
Glucose, Bld: 99 mg/dL (ref 70–99)
Potassium: 4.2 mEq/L (ref 3.5–5.1)
Sodium: 131 mEq/L — ABNORMAL LOW (ref 135–145)

## 2011-06-17 NOTE — Discharge Summary (Signed)
[name], [name]:  [id number]  MEDICAL RECORD NO.:  [id number]  LOCATION:  3731                         FACILITY:  [facility]  PHYSICIAN:  Georgann Housekeeper, MD      DATE OF BIRTH:  Apr 28, 1932  DATE OF ADMISSION:  06/14/2011 DATE OF DISCHARGE:  06/16/2011                              DISCHARGE SUMMARY   DISCHARGE DIAGNOSES: 1. Weakness, aching all over. 2. Hyponatremia secondary to antidepressant. 3. Urinary tract infection. 4. History of atrial fibrillation, rate controlled. 5. History of diastolic heart failure, chronic, not active issue. 6. History of hyperthyroidism, controlled on medication. 7. Anxiety disorder. 8. Osteoarthritis. 9. Depression.  MEDICATION ON DISCHARGE: 1. Benicar 20 mg at bedtime. 2. Coumadin 5 mg half a tablet daily. 3. Klonopin 1 mg b.i.d. 4. Cipro 250 b.i.d. for 5 days. 5. Tapazole 5 mg t.i.d. 6. Cardizem CD 240 mg q.a.m. 7. Toprol 50 mg 1 tablet q.a.m. 8. Oxycodone q.8 p.r.n. for pain.  MEDICATIONS DISCONTINUED:  Lexapro.  LABORATORY DATA:  Sodium 120 on admission, at discharge was 131, creatinine 0.8, potassium 4.2.  CBC:  Hemoglobin of 15, platelets of 186, white count of 9.3.  Cardiac markers negative.  The BNP was 389 in the normal range.  TSH was 2.4.  The urinalysis was positive.  PT/INR at the time of discharge was 2.1 and PT of 24.7.  HOSPITAL COURSE:  This is a 77 year old female admitted with feeling weak and hurting. 1. Found to have hyponatremia.  The patient was started on normal     saline and urine osmolarity was slightly low.  Chest x-ray was     negative.  UA was positive for UTI.  Hyponatremia was thought to be     related to her Lexapro which was recently started before this     admission, which was discontinued.  She got the normal saline     fluid.  Her sodium went up to 131.  She started feeling better.     Lexapro will be discontinued.  We will monitor her sodium     outpatient. 2. UTI.  The  patient was started on Rocephin IV which was subsequently     switched to p.o. Cipro.  She will continue that for 5 days.  She     remained afebrile.  White count was normal.  No abdominal pain or     vomiting. 3. History of chronic atrial fibrillation, on Coumadin, rate     controlled.  Heart rate in the 70s.  INR was therapeutic at time of     discharge.  She will maintain her Coumadin 2.5 mg daily and her     beta-blocker and calcium channel blocker. 4. Hypertension.  Continue on current medication.  Blood pressure is     stable. 5. Hyperthyroidism.  Her TSH has been stable.  Continue on Tapazole at     current dose. 6. Osteoarthritis, chronic pain.  Oxycodone p.r.n. for pain. 7. History of anxiety.  Continue on Klonopin.  Lexapro discontinued     because of hyponatremia.  We will have Home Health to come and help     her with the medications and I will see her back in 1 week.  Georgann Housekeeper, MD     KH/MEDQ  D:  06/16/2011  T:  06/16/2011  Job:  [id number]  Electronically Signed by Georgann Housekeeper MD on 06/17/2011 11:30:34 AM

## 2011-06-20 ENCOUNTER — Emergency Department (HOSPITAL_COMMUNITY): Payer: PRIVATE HEALTH INSURANCE

## 2011-06-20 ENCOUNTER — Emergency Department (HOSPITAL_COMMUNITY)
Admission: EM | Admit: 2011-06-20 | Discharge: 2011-06-20 | Disposition: A | Discharge status: Home / Self Care | Payer: PRIVATE HEALTH INSURANCE | Attending: Emergency Medicine | Admitting: Emergency Medicine

## 2011-06-20 DIAGNOSIS — R5381 Other malaise: Secondary | ICD-10-CM | POA: Insufficient documentation

## 2011-06-20 DIAGNOSIS — I517 Cardiomegaly: Secondary | ICD-10-CM | POA: Insufficient documentation

## 2011-06-20 DIAGNOSIS — I10 Essential (primary) hypertension: Secondary | ICD-10-CM | POA: Insufficient documentation

## 2011-06-20 DIAGNOSIS — E871 Hypo-osmolality and hyponatremia: Secondary | ICD-10-CM | POA: Insufficient documentation

## 2011-06-20 DIAGNOSIS — E0590 Thyrotoxicosis, unspecified without thyrotoxic crisis or storm: Secondary | ICD-10-CM | POA: Insufficient documentation

## 2011-06-20 DIAGNOSIS — R5383 Other fatigue: Secondary | ICD-10-CM | POA: Insufficient documentation

## 2011-06-20 DIAGNOSIS — I4891 Unspecified atrial fibrillation: Secondary | ICD-10-CM | POA: Insufficient documentation

## 2011-06-20 DIAGNOSIS — M79609 Pain in unspecified limb: Secondary | ICD-10-CM | POA: Insufficient documentation

## 2011-06-20 DIAGNOSIS — I509 Heart failure, unspecified: Secondary | ICD-10-CM | POA: Insufficient documentation

## 2011-06-20 LAB — URINALYSIS, ROUTINE W REFLEX MICROSCOPIC
Glucose, UA: NEGATIVE mg/dL
Leukocytes, UA: NEGATIVE
Specific Gravity, Urine: 1.009 (ref 1.005–1.030)
pH: 7 (ref 5.0–8.0)

## 2011-06-20 LAB — POCT I-STAT, CHEM 8
BUN: 8 mg/dL (ref 6–23)
Calcium, Ion: 1.15 mmol/L (ref 1.12–1.32)
Chloride: 96 mEq/L (ref 96–112)
Creatinine, Ser: 0.7 mg/dL (ref 0.50–1.10)
Glucose, Bld: 114 mg/dL — ABNORMAL HIGH (ref 70–99)
HCT: 47 % — ABNORMAL HIGH (ref 36.0–46.0)
Hemoglobin: 16 g/dL — ABNORMAL HIGH (ref 12.0–15.0)
Potassium: 4.1 mEq/L (ref 3.5–5.1)
Sodium: 128 mEq/L — ABNORMAL LOW (ref 135–145)
TCO2: 22 mmol/L (ref 0–100)

## 2011-06-20 LAB — URINE MICROSCOPIC-ADD ON

## 2011-06-23 ENCOUNTER — Emergency Department (HOSPITAL_COMMUNITY)
Admission: EM | Admit: 2011-06-23 | Discharge: 2011-06-24 | Disposition: A | Discharge status: Home / Self Care | Payer: PRIVATE HEALTH INSURANCE | Attending: Emergency Medicine | Admitting: Emergency Medicine

## 2011-06-23 DIAGNOSIS — F3289 Other specified depressive episodes: Secondary | ICD-10-CM | POA: Insufficient documentation

## 2011-06-23 DIAGNOSIS — I509 Heart failure, unspecified: Secondary | ICD-10-CM | POA: Insufficient documentation

## 2011-06-23 DIAGNOSIS — G8929 Other chronic pain: Secondary | ICD-10-CM | POA: Insufficient documentation

## 2011-06-23 DIAGNOSIS — I10 Essential (primary) hypertension: Secondary | ICD-10-CM | POA: Insufficient documentation

## 2011-06-23 DIAGNOSIS — F329 Major depressive disorder, single episode, unspecified: Secondary | ICD-10-CM | POA: Insufficient documentation

## 2011-06-23 DIAGNOSIS — I4891 Unspecified atrial fibrillation: Secondary | ICD-10-CM | POA: Insufficient documentation

## 2011-06-23 DIAGNOSIS — E871 Hypo-osmolality and hyponatremia: Secondary | ICD-10-CM | POA: Insufficient documentation

## 2011-06-23 DIAGNOSIS — M129 Arthropathy, unspecified: Secondary | ICD-10-CM | POA: Insufficient documentation

## 2011-06-23 LAB — DIFFERENTIAL
Basophils Absolute: 0 10*3/uL (ref 0.0–0.1)
Basophils Relative: 0 % (ref 0–1)
Eosinophils Absolute: 0.1 10*3/uL (ref 0.0–0.7)
Eosinophils Relative: 1 % (ref 0–5)
Lymphs Abs: 2.4 10*3/uL (ref 0.7–4.0)

## 2011-06-23 LAB — URINE MICROSCOPIC-ADD ON

## 2011-06-23 LAB — RAPID URINE DRUG SCREEN, HOSP PERFORMED
Benzodiazepines: NOT DETECTED
Opiates: NOT DETECTED

## 2011-06-23 LAB — COMPREHENSIVE METABOLIC PANEL
Albumin: 3.9 g/dL (ref 3.5–5.2)
BUN: 14 mg/dL (ref 6–23)
Creatinine, Ser: 0.83 mg/dL (ref 0.50–1.10)
Potassium: 3.9 mEq/L (ref 3.5–5.1)
Total Protein: 6.9 g/dL (ref 6.0–8.3)

## 2011-06-23 LAB — PROTIME-INR
INR: 1.96 — ABNORMAL HIGH (ref 0.00–1.49)
Prothrombin Time: 22.7 seconds — ABNORMAL HIGH (ref 11.6–15.2)

## 2011-06-23 LAB — URINALYSIS, ROUTINE W REFLEX MICROSCOPIC
Glucose, UA: NEGATIVE mg/dL
Ketones, ur: NEGATIVE mg/dL
pH: 5 (ref 5.0–8.0)

## 2011-06-23 LAB — CBC
MCV: 78.4 fL (ref 78.0–100.0)
Platelets: 222 10*3/uL (ref 150–400)
RDW: 17.6 % — ABNORMAL HIGH (ref 11.5–15.5)
WBC: 11 10*3/uL — ABNORMAL HIGH (ref 4.0–10.5)

## 2011-06-23 LAB — ETHANOL: Alcohol, Ethyl (B): <11 mg/dL (ref 0–11)

## 2011-06-24 LAB — BASIC METABOLIC PANEL
GFR calc Af Amer: >90 mL/min (ref >90)
GFR calc non Af Amer: 80 mL/min — ABNORMAL LOW (ref >90)
Potassium: 3.9 mEq/L (ref 3.5–5.1)
Sodium: 128 mEq/L — ABNORMAL LOW (ref 135–145)

## 2011-06-25 LAB — URINE CULTURE: Culture  Setup Time: 201210260221

## 2011-06-29 NOTE — H&P (Signed)
NAME:  Audrey Burke, Audrey Burke         ACCOUNT NO.:  [id number]  MEDICAL RECORD NO.:  [id number]  LOCATION:  [facility]                         FACILITY:  [facility]  PHYSICIAN:  Richarda Overlie, MD       DATE OF BIRTH:  [birth year]  DATE OF ADMISSION:  06/05/2011 DATE OF DISCHARGE:                             HISTORY & PHYSICAL   PRIMARY CARE PHYSICIAN:  Georgann Housekeeper, MD  CHIEF COMPLAINT:  Left shoulder pain.  SUBJECTIVE:  A 77 year old female with a recent history of newly diagnosed atrial fibrillation, congestive heart failure, hypothyroidism, and hypertension who presents to the ER with left-sided chest pain.  The patient's pain is fairly atypical on presentation and stops in the cervical spine and radiates into the left shoulder.  Earlier this morning she also had right shoulder pain.  She denies any history of fall.  The pain has been worsening over the course of the last 2 weeks. The patient also complains of a headache.  She however denies any chest pressure or chest tightness, any shortness of breath.  Denies any nausea, vomiting, abdominal pain.  The patient states that she took three sublingual nitro without any improvement in her left shoulder pain, but did have worsening of her headache.  She denies any hemoptysis or hematemesis.  Denies any melena or hematochezia.  The patient does state that the left-sided shoulder pain is exacerbated with movement of the shoulder.  PAST MEDICAL HISTORY: 1. New-onset atrial fibrillation, history of diastolic heart failure. 2. Hypertension. 3. Anxiety. 4. Osteoarthritis. 5. B12 deficiency.  CURRENT MEDICATIONS:  Methimazole, metoprolol, potassium, Coumadin, Lasix, vitamin B12, Benicar, Celebrex, and Klonopin.  REVIEW OF SYSTEMS:  A complete 14-point review of systems was done and as documented in HPI.  ALLERGIES:  AMOXICILLIN.  FAMILY HISTORY:  Reviewed and found to be noncontributory.  SOCIAL HISTORY:  The patient speaks primarily [place]  and does speak [place], but it is broken and is very difficult to communicate with her and her family.  She does not smoke.  She is accompanied by her husband of 54 years.  She has two grown children, both of them are white and are healthy.  LABORATORY DATA:  Sodium 124, potassium 4.2, chloride 91, bicarb 22, glucose 147, BUN 17, creatinine 0.82, calcium 9.1, glucose 147, BUN 17, creatinine 0.82, calcium 9.1, troponin negative.  INR is 1.13.  CBC, WBC 7.4, hemoglobin 16.3, hematocrit 43.4, and a platelet count of 182. Cervical spine shows negative exam.  CT of the head without contrast shows atrophy and small-vessel disease.  Chest x-ray shows cardiomegaly without any evidence of acute pulmonary vascular congestion.  BMP, sodium 124, potassium 4.2, chloride 91, bicarb 22, glucose 147, BUN 17, creatinine 0.82, and calcium of 9.1.  CBC, WBC 8.3, hemoglobin 15.4, hematocrit 43.5, and a platelet count of 192.  ASSESSMENT: 1. Atrial fibrillation with rapid ventricular response with heart rate     in the 120s. 2. Subtherapeutic INR. 3. Hyponatremia likely secondary to Lasix. 4. Left-sided chest pain related to cervical radiculopathy, atypical     for cardiac pain. 5. History of diastolic heart failure, currently stable. 6. Hyponatremia likely secondary to Lasix.  PLAN:  The patient will be admitted to telemetry.  For  rate control, we will start her on IV diltiazem and then titrate the drip.  We will continue her on a beta-blocker.  For her left-sided chest pain and neck pain, we will do an MRI of the C- spine as well as an x-ray of the left shoulder.  We will start her on Robaxin.  We will continue her on her beta-blocker.  We will also start her on some p.r.n. Robaxin and use oxycodone and morphine as needed for pain.  We will follow her cardiac enzymes.  The patient recently had a cardiac consultation, therefore this is not being requested at this time unless she has any changes in  her cardiac enzymes.  For her hyponatremia, we will start her on hydration with IV normal saline to see if her sodium corrects and hold her Lasix.  We will check her serum osmolality, urine osmolality and repeat a thyroid function test.  The case was discussed to the best of my ability with the patient who does understand English as well as her husband.  She is a full code.     Richarda Overlie, MD     NA/MEDQ  D:  06/05/2011  T:  06/05/2011  Job:  [id number]  Electronically Signed by Richarda Overlie MD on 06/29/2011 11:44:17 PM

## 2011-07-01 NOTE — H&P (Signed)
Audrey Burke, SCHRINER NO.:  [id number]  MEDICAL RECORD NO.:  [id number]  LOCATION:  3731                         FACILITY:  [facility]  PHYSICIAN:  Lonia Blood, M.D.      DATE OF BIRTH:  03-23-1932  DATE OF ADMISSION:  06/14/2011 DATE OF DISCHARGE:                             HISTORY & PHYSICAL   PRIMARY CARE PHYSICIAN:  Dr. Georgann Housekeeper.  PRESENTING COMPLAINT:  Generalized weakness and pain in her legs.  HISTORY OF PRESENT ILLNESS:  The patient is a 77 year old female with multiple medical problems including AFib and CHF, who apparently was doing fine at home, but she recently started feeling generalized weakness and tiredness.  She also had some diarrhea, nausea, vomiting last week.  Feels weak and decided to come to the emergency room.  She has been having bilateral leg pain from the front of her knees to her ankles.  Also recurrent left shoulder pain.  She was in the hospital until June 07, 2011, where she was admitted with AFib with RVR.  At that point, she had subtherapeutic Coumadin.  The patient was found today to have a sodium of 120, hence she is being admitted for further management.  PAST MEDICAL HISTORY:  Significant for atrial fibrillation with occasional RVR, hypothyroidism, osteoarthritis, CHF, hypertension, depression, history of chronic pain.  Anxiety disorder.  Prior history of hyponatremia.  ALLERGIES:  To AMOXICILLIN.  MEDICATIONS: 1. Benicar 20 mg at bedtime. 2. Diltiazem 240 mg daily. 3. Lisinopril 5 mg daily. 4. Methimazole 5 mg t.i.d. 5. Oxycodone 5 mg q.8 hours p.r.n. for pain. 6. Toprol-XL 50 mg daily. 7. Coumadin 5 mg half tablet as diet. 8. Klonopin 1 mg b.i.d. The patient was taken off diuretics after her last hospitalization.  SOCIAL HISTORY:  The patient is married, lives with her husband. Primarily from [place], speaks very little [place].  The patient has 2 grown-up children that are healthy.  Denied tobacco, alcohol,  or IV drug use.  FAMILY HISTORY:  Noncontributory.  REVIEW OF SYSTEMS:  All systems reviewed are currently negative except per HPI.  PHYSICAL EXAMINATION:  VITAL SIGNS:  Temperature 98.6, blood pressure 152/67, pulse 69, respiratory rate 18, sats 95% room air. GENERAL:  She is awake, alert, oriented, communicating although with difficulties, but in no acute distress. HEENT:  PERRL.  EOMI.  No significant pallor.  No jaundice.  No rhinorrhea.  NECK:  Supple.  No JVD.  No lymphadenopathy. RESPIRATORY:  She has good air entry bilaterally.  No wheezes, no rales, no crackles. CARDIOVASCULAR SYSTEM:  Patient has S1, S2.  No audible murmur. ABDOMEN:  Soft, full, nontender with positive bowel sounds. EXTREMITIES:  No edema, cyanosis, or clubbing. SKIN:  No rashes.  No ulcers.  LABORATORY DATA:  Initial cardiac enzymes are negative.  White count 10.3, hemoglobin 14.4, platelet 188.  PTT 21.3, INR 1.81, PTT 33. Sodium 120, potassium 4.1, chloride 89, CO2 of 23, glucose 122, BUN 15, creatinine 0.74.  Rest of the LFTs within normal.  Her urinalysis showed cloudy urine with small ketones and large leukocyte esterase.  Urine microscopy showed WBC 21-50 with rare bacteria.  ProBNP 389.  Chest x-ray showed mild AC joint DJD, no acute osseous abnormality.  ASSESSMENT:  This is a 77 year old female presenting with weakness and hyponatremia.  More than likely from diuretics.  Her sodium is much lower than what it was when she was in the hospital around October 8.  PLAN: 1. Hyponatremia.  Admit the patient, check serum osmolality, hydrate     the patient with saline, and follow her sodium level closely.  She     is off diuretic, but still have hyponatremia, but it looks like     also chloride is low which means her volume is may be contracted. 2. Generalized weakness, more than likely from urinary tract infection     and hyponatremia. 3. Urinary tract infection.  I will put her on empiric  Rocephin. 4. Atrial fibrillation.  Her rate is controlled.  Her INR is slightly     subtherapeutic, but close to 2.0, continue with Coumadin. 5. Osteoarthritis.  Continue with pain medication, PT and OT in the     hospital. 6. Hypertension.  Continue his home medication. 7. Depression/anxiety.  Again, we will continue with home medications. 8. Chronic pain.  She is on oxycodone, which we will continue with.     She will need some PT and OT in the hospital.  Once her sodium is     stabilized, she will be discharged home.     Lonia Blood, M.D.     Verlin Grills  D:  06/15/2011  T:  06/15/2011  Job:  [id number]  Electronically Signed by Lonia Blood M.D. on 07/01/2011 05:54:36 AM

## 2011-11-12 ENCOUNTER — Ambulatory Visit (INDEPENDENT_AMBULATORY_CARE_PROVIDER_SITE_OTHER): Payer: PRIVATE HEALTH INSURANCE | Admitting: Family Medicine

## 2011-11-12 VITALS — BP 174/122 | HR 97 | Temp 98.3°F | Resp 18 | Ht 61.5 in | Wt 178.8 lb

## 2011-11-12 DIAGNOSIS — J329 Chronic sinusitis, unspecified: Secondary | ICD-10-CM

## 2011-11-12 DIAGNOSIS — I10 Essential (primary) hypertension: Secondary | ICD-10-CM

## 2011-11-12 MED ORDER — AMOXICILLIN 875 MG PO TABS: 875.0000 mg | ORAL_TABLET | Freq: Two times a day (BID) | ORAL | Status: AC

## 2011-11-12 MED ORDER — FLUTICASONE PROPIONATE 50 MCG/ACT NA SUSP: 2.0000 | Freq: Every day | NASAL | Status: DC

## 2011-11-12 MED ORDER — AMLODIPINE BESYLATE 5 MG PO TABS: 5.0000 mg | ORAL_TABLET | Freq: Every day | ORAL | Status: DC

## 2011-11-12 NOTE — Progress Notes (Signed)
Subjective: 78 year old [place] lady who comes in complaining of her sinuses. She didn't been bothered by this for 18 days. She denies fever. She has been having a lot of head congestion and a little drainage. She's tried various OTC medications. She has not smoked. She apparently periodically gets bad sinus infections and says he takes antibiotics and clear.  She and her husband both talk at the same time and atenolol difficult to get her history. She is on Coumadin for an arrhythmia, but has not seemed to think she needs it. An uncertain who is managing her medications. I believe she went to a endocrinologist in [place]. She had some thyroid evaluation but then was told that an ultrasound was okay.  She is concerned about her blood pressure is high today. Apparently always runs 160 or above. She takes Benicar daily for the blood pressure. No chest pains or shortness of breath or cough.  Objective: Overweight elderly female. Head congested. TMs normal. Nose looks swollen and inflamed. Throat clear. Neck supple without nodes. Chest clear to auscultation. Heart regular without murmurs. BP 180/100 when I checked it.  Assessment: Sinusitis and rhinitis Hypertension poorly controlled History of arrhythmia, currently in sinus rhythm.  Plan: Patient needs a complete physical exam some time by her primary care and they full clarification of what she needs to be on and what she does not need to be on. Never could quite figure out who she wants to be her main doctor in life.Advised to continue seeing her regular doctors.  Treated sinusitis with antibiotics.   Add amlodipine to her blood pressure medication.

## 2011-11-12 NOTE — Patient Instructions (Signed)
Take the antibiotics as directed on the prescription.  Use nose spray 2 sprays each nostril once daily  Drink plenty of fluids.  Advise seeing your primary doctor to get all your medicines reviewed and to decide what you need to be on or don't need to be on. If you do not have a regular primary doctor, you might consider going to the geriatric doctors. He numbers and geriatric group are: [facility] [phone]

## 2012-02-26 ENCOUNTER — Ambulatory Visit (INDEPENDENT_AMBULATORY_CARE_PROVIDER_SITE_OTHER): Payer: PRIVATE HEALTH INSURANCE | Admitting: Emergency Medicine

## 2012-02-26 VITALS — BP 168/96 | HR 101 | Temp 98.3°F | Resp 18 | Ht 61.5 in | Wt 182.4 lb

## 2012-02-26 DIAGNOSIS — J018 Other acute sinusitis: Secondary | ICD-10-CM

## 2012-02-26 MED ORDER — AMOXICILLIN 500 MG PO CAPS: 500.0000 mg | ORAL_CAPSULE | Freq: Three times a day (TID) | ORAL | Status: AC

## 2012-02-26 NOTE — Progress Notes (Signed)
    Patient Name: Audrey Burke Date of Birth: 1932-01-09 Medical Record Number: [id number] Gender: female Date of Encounter: 02/26/2012  Chief Complaint: Sinusitis, Otalgia and Generalized Body Aches   History of Present Illness:  Audrey Burke is a 78 y.o. very pleasant female patient who presents with the following:  Nasal congestion, post nasal drainage, sinus pressure.  History of sinusitis.  Requesting zpak.  No fever or chills, nausea or vomiting, some non productive cough.    There is no problem list on file for this patient.  No past medical history on file. No past surgical history on file. History  Substance Use Topics  . Smoking status: Never Smoker   . Smokeless tobacco: Not on file  . Alcohol Use: Not on file   No family history on file. No Known Allergies  Medication list has been reviewed and updated.  Current Outpatient Prescriptions on File Prior to Visit  Medication Sig Dispense Refill  . amLODipine (NORVASC) 5 MG tablet Take 1 tablet (5 mg total) by mouth daily.  30 tablet  4  . celecoxib (CELEBREX) 200 MG capsule Take 200 mg by mouth daily.      . furosemide (LASIX) 20 MG tablet Take 20 mg by mouth 2 (two) times daily.      Marland Kitchen gabapentin (NEURONTIN) 400 MG capsule Take 400 mg by mouth daily.      . mirtazapine (REMERON) 30 MG tablet Take 30 mg by mouth at bedtime.      Marland Kitchen olmesartan (BENICAR) 40 MG tablet Take 40 mg by mouth daily.      . Rivaroxaban (XARELTO) 20 MG TABS Take 20 mg by mouth daily.      . famotidine (PEPCID) 20 MG tablet Take 20 mg by mouth 2 (two) times daily.      . fluticasone (FLONASE) 50 MCG/ACT nasal spray Place 2 sprays into the nose daily.  16 g  1  . potassium chloride SA (K-DUR,KLOR-CON) 20 MEQ tablet Take 20 mEq by mouth daily.      Marland Kitchen warfarin (COUMADIN) 5 MG tablet Take 2.5 mg by mouth daily.        Review of Systems:  As per HPI, otherwise negative.    Physical Examination: Filed Vitals:   02/26/12 1233    BP: 168/96  Pulse: 101  Temp: 98.3 F (36.8 C)  Resp: 18   Filed Vitals:   02/26/12 1233  Height: 5' 1.5" (1.562 m)  Weight: 182 lb 6.4 oz (82.736 kg)   Body mass index is 33.91 kg/(m^2). Ideal Body Weight: Weight in (lb) to have BMI = 25: 134.2   GEN: WDWN, NAD, Non-toxic, A & O x 3 HEENT: Atraumatic, Normocephalic. Neck supple. No masses, No LAD.  Green post nasal drainage Ears and Nose: No external deformity. CV: RRR, No M/G/R. No JVD. No thrill. No extra heart sounds. PULM: CTA B, no wheezes, crackles, rhonchi. No retractions. No resp. distress. No accessory muscle use. ABD: S, NT, ND, +BS. No rebound. No HSM. EXTR: No c/c/e NEURO Normal gait.  PSYCH: Normally interactive. Conversant. Not depressed or anxious appearing.  Calm demeanor.    EKG / Labs / Xrays: None available at time of encounter  Assessment and Plan: Sinusitis Follow up as needed for new or worsened symptoms Amoxicillin  Carmelina Dane, MD

## 2012-02-26 NOTE — Patient Instructions (Addendum)

## 2012-04-02 ENCOUNTER — Other Ambulatory Visit: Payer: Self-pay | Admitting: Family Medicine

## 2012-04-18 ENCOUNTER — Ambulatory Visit (INDEPENDENT_AMBULATORY_CARE_PROVIDER_SITE_OTHER): Payer: PRIVATE HEALTH INSURANCE | Admitting: Family Medicine

## 2012-04-18 VITALS — BP 204/90 | HR 97 | Temp 97.8°F | Resp 16 | Ht 61.25 in | Wt 185.2 lb

## 2012-04-18 DIAGNOSIS — R682 Dry mouth, unspecified: Secondary | ICD-10-CM

## 2012-04-18 DIAGNOSIS — IMO0001 Reserved for inherently not codable concepts without codable children: Secondary | ICD-10-CM

## 2012-04-18 DIAGNOSIS — R51 Headache: Secondary | ICD-10-CM

## 2012-04-18 DIAGNOSIS — I10 Essential (primary) hypertension: Secondary | ICD-10-CM

## 2012-04-18 DIAGNOSIS — K117 Disturbances of salivary secretion: Secondary | ICD-10-CM

## 2012-04-18 DIAGNOSIS — J329 Chronic sinusitis, unspecified: Secondary | ICD-10-CM

## 2012-04-18 DIAGNOSIS — M791 Myalgia, unspecified site: Secondary | ICD-10-CM

## 2012-04-18 LAB — POCT GLYCOSYLATED HEMOGLOBIN (HGB A1C): Hemoglobin A1C: 5.5

## 2012-04-18 MED ORDER — METOPROLOL SUCCINATE ER 25 MG PO TB24: 25.0000 mg | ORAL_TABLET | Freq: Every day | ORAL | Status: DC

## 2012-04-18 MED ORDER — OLMESARTAN MEDOXOMIL-HCTZ 40-12.5 MG PO TABS: 1.0000 | ORAL_TABLET | Freq: Every day | ORAL | Status: DC

## 2012-04-18 NOTE — Patient Instructions (Addendum)
Take medications as directed for the blood pressure:  Continue amlodipine 5 mg.  Stop the Benicar (olmesartan) and change to Benicar/HCT (olmesartan hct 40/12.5).    Add metoprolol 25 mg daily  Take antibiotics (Omnicef--cefdinir) for the sinuses. Use the nose spray 2 sprays in each nostril once daily.  Take tylenol (acetominophen) 500 mg 2 tablets 3 times a day as needed for headache. (non-prescription)  Return in 1 week to follow-up the blood pressure.

## 2012-04-18 NOTE — Progress Notes (Signed)
Subjective: 78 year old lady who began feeling bad last Friday. For 5 days she's had a frontal headache and pain in her maxillary regions. She feels like she cannot breathe through her nose, is very congested. She does have some phlegm. Her ears are thoughtful. Her throat is not sore. She is not coughing. She hurts all over. Hurts in the back of her hand. Her chairback. Hurts her arms and legs. She tried to work out in the garden and got worse. She does not usually have these symptoms. She says her mouth is dry  Her current medications include Benicar 40 daily and amlodipine 5 daily. She brought in a bottle of Norvasc 10 mg and says she cannot tolerate them.  Objective: Repeat blood pressure is still significantly elevated at 210/98. TMs are normal. Has a little dry crusting along both canals. Throat was clear. Nose is very congested with a lot of swelling nasal mucosa, worse on the right the left. Tender maxillary sinus areas. Neck supple without significant nodes. Chest is clear to. Heart regular without murmurs. Abdomen mass tenderness.  Assessment Sinusitis Uncontrolled hypertension Myalgias Headache Dry mouth  Plan: Check a couple of labs on her because of the blood pressure in the dry mouth. Treat with antibiotics for the sinuses, but he did be sure certain whether she has diabetes or not. Need to more aggressively try and control blood pressure to  Plan: Will check some blood cultures are because of the high blood pressure.  Results for orders placed in visit on 04/18/12  GLUCOSE, POCT (MANUAL RESULT ENTRY)      Component Value Range   POC Glucose 107 (*) 70 - 99 mg/dl  POCT GLYCOSYLATED HEMOGLOBIN (HGB A1C)      Component Value Range   Hemoglobin A1C 5.5

## 2012-04-19 LAB — COMPREHENSIVE METABOLIC PANEL
AST: 18 U/L (ref 0–37)
Albumin: 4.4 g/dL (ref 3.5–5.2)
BUN: 11 mg/dL (ref 6–23)
Calcium: 9.3 mg/dL (ref 8.4–10.5)
Chloride: 98 mEq/L (ref 96–112)
Creat: 0.72 mg/dL (ref 0.50–1.10)
Glucose, Bld: 101 mg/dL — ABNORMAL HIGH (ref 70–99)

## 2012-04-20 ENCOUNTER — Encounter: Payer: Self-pay | Admitting: Family Medicine

## 2012-04-25 ENCOUNTER — Ambulatory Visit (INDEPENDENT_AMBULATORY_CARE_PROVIDER_SITE_OTHER): Payer: PRIVATE HEALTH INSURANCE | Admitting: Family Medicine

## 2012-04-25 VITALS — BP 182/104 | HR 85 | Temp 98.0°F | Resp 18 | Ht 61.0 in | Wt 185.6 lb

## 2012-04-25 DIAGNOSIS — I10 Essential (primary) hypertension: Secondary | ICD-10-CM

## 2012-04-25 MED ORDER — METOPROLOL SUCCINATE ER 25 MG PO TB24: 25.0000 mg | ORAL_TABLET | Freq: Two times a day (BID) | ORAL | Status: DC

## 2012-04-25 NOTE — Progress Notes (Signed)
Subjective: Is here for a followup of the blood pressure. Feels okay with no other complaints.  Objective: Repeat blood pressure was 176/80. Chest is clear. Heart regular.  Assessment: Hypertension, improving control  Plan: Increase the metoprolol to 25 mg twice a day and return in 2 or 3 weeks for one more recheck.

## 2012-04-25 NOTE — Patient Instructions (Signed)
Take the metoprolol one pill twice daily  Return in 2-3 weeks for a recheck one more time.

## 2012-05-06 ENCOUNTER — Other Ambulatory Visit: Payer: Self-pay | Admitting: Physician Assistant

## 2012-05-07 ENCOUNTER — Ambulatory Visit (INDEPENDENT_AMBULATORY_CARE_PROVIDER_SITE_OTHER): Payer: PRIVATE HEALTH INSURANCE | Admitting: Family Medicine

## 2012-05-07 VITALS — BP 192/92 | HR 81 | Temp 98.5°F | Resp 18

## 2012-05-07 DIAGNOSIS — Z79899 Other long term (current) drug therapy: Secondary | ICD-10-CM

## 2012-05-07 DIAGNOSIS — I10 Essential (primary) hypertension: Secondary | ICD-10-CM

## 2012-05-07 DIAGNOSIS — Z724 Inappropriate diet and eating habits: Secondary | ICD-10-CM

## 2012-05-07 DIAGNOSIS — J329 Chronic sinusitis, unspecified: Secondary | ICD-10-CM

## 2012-05-07 MED ORDER — AZITHROMYCIN 250 MG PO TABS: ORAL_TABLET | ORAL | Status: AC

## 2012-05-07 MED ORDER — FAMOTIDINE 20 MG PO TABS: 20.0000 mg | ORAL_TABLET | Freq: Two times a day (BID) | ORAL | Status: DC

## 2012-05-07 MED ORDER — METOPROLOL SUCCINATE ER 50 MG PO TB24: ORAL_TABLET | ORAL | Status: DC

## 2012-05-07 MED ORDER — AMLODIPINE BESYLATE 5 MG PO TABS: ORAL_TABLET | ORAL | Status: DC

## 2012-05-07 MED ORDER — OLMESARTAN MEDOXOMIL 40 MG PO TABS: 40.0000 mg | ORAL_TABLET | Freq: Every day | ORAL | Status: DC

## 2012-05-07 NOTE — Progress Notes (Signed)
78 yo very anxious [place] woman who is here with her husband to follow up on blood pressure.  She wants to see only Dr. Onalee Hua. She says she is no acute pain, but aches all over.  Also denies shortness of breath. Upon questioning further, she has polyuria as well.  Objective:  Anxious appearing elderly woman in NAD Results for orders placed in visit on 04/18/12  COMPREHENSIVE METABOLIC PANEL      Component Value Range   Sodium 131 (*) 135 - 145 mEq/L   Potassium 4.1  3.5 - 5.3 mEq/L   Chloride 98  96 - 112 mEq/L   CO2 24  19 - 32 mEq/L   Glucose, Bld 101 (*) 70 - 99 mg/dL   BUN 11  6 - 23 mg/dL   Creat 0.98  1.19 - 1.47 mg/dL   Total Bilirubin 0.5  0.3 - 1.2 mg/dL   Alkaline Phosphatase 85  39 - 117 U/L   AST 18  0 - 37 U/L   ALT 14  0 - 35 U/L   Total Protein 7.1  6.0 - 8.3 g/dL   Albumin 4.4  3.5 - 5.2 g/dL   Calcium 9.3  8.4 - 82.9 mg/dL  GLUCOSE, POCT (MANUAL RESULT ENTRY)      Component Value Range   POC Glucose 107 (*) 70 - 99 mg/dl  POCT GLYCOSYLATED HEMOGLOBIN (HGB A1C)      Component Value Range   Hemoglobin A1C 5.5

## 2012-05-07 NOTE — Progress Notes (Signed)
Subjective: Patient is here for a complaints of not feeling well. She has been achy and a little bit cold and hot. She has some sinus congestion. Denies headache or dizziness. Denies chest pains or shortness of breath. Denies GI symptoms. Denied GU symptoms to me, though I see that she complains some to the other doctor going to see her. She's worried about her blood pressure.   Objective: Anxious. Alert and oriented. Blood pressure is still high, 190/92 I believe it was. Throat clear. Neck supple without nodes thyromegaly. Chest clear. Heart regular without murmurs. And soft nontender. Extremities without edema.  Assessment: Hypertension poor control Polypharmacy Possible sinusitis  Plan: View her medications in detail. I tried to straighten things out.   Amlodipine 5 mg daily for blood pressure    Benicar 40  ( olmesartan) one  Daily for blood pressure    Furosemide (lasix) 20 mg daily for fluid    flonase (fluticasone) two sprays each nostril daily    xarelto (rivaroxaban)  Blood thinner (I am not sure why this was prescribed, but continue it unless the other doctor stops it)    mirtazapine (remeron) 30 mg one daily (for depression and nerves)    Gabepentin 800 mg one daily (for pain)    Tylenol  (acetominophen) 500 mg 2 every 12 hours as needed for pain (non prescription)    Metoprolol 50 mg one daily for blood pressure   gave her copies of all of this. She is to take him to her other doctor who is prescribing the anticoagulant. She is to take this to the pharmacy. She is to bring in all her medications each visit.

## 2012-05-07 NOTE — Patient Instructions (Addendum)
The following are the only medicines I want you to take:  I think this is what you should be on.  Please keep this with you and show it to your doctors and the pharmacist. We need to keep your medicine straight   Amlodipine 5 mg daily for blood pressure    Benicar 40  ( olmesartan) one  Daily for blood pressure    Furosemide (lasix) 20 mg daily for fluid    flonase (fluticasone) two sprays each nostril daily    xarelto (rivaroxaban)  Blood thinner (I am not sure why this was prescribed, but continue it unless the other doctor stops it)    mirtazapine (remeron) 30 mg one daily (for depression and nerves)    Gabepentin 800 mg one daily (for pain)    Tylenol  (acetominophen) 500 mg 2 every 12 hours as needed for pain (non prescription)    Metoprolol 50 mg one daily for blood pressure   Famotidine (pepcid) one daily for stomach  Please always bring your medicine list  Azithromycin for 5 days for sinuses

## 2012-05-12 ENCOUNTER — Other Ambulatory Visit: Payer: Self-pay | Admitting: Family Medicine

## 2012-05-14 ENCOUNTER — Ambulatory Visit (INDEPENDENT_AMBULATORY_CARE_PROVIDER_SITE_OTHER): Payer: PRIVATE HEALTH INSURANCE | Admitting: Family Medicine

## 2012-05-14 VITALS — BP 158/78 | HR 84 | Temp 98.9°F | Resp 16 | Ht 65.0 in | Wt 185.0 lb

## 2012-05-14 DIAGNOSIS — H019 Unspecified inflammation of eyelid: Secondary | ICD-10-CM

## 2012-05-14 DIAGNOSIS — G8929 Other chronic pain: Secondary | ICD-10-CM

## 2012-05-14 DIAGNOSIS — J329 Chronic sinusitis, unspecified: Secondary | ICD-10-CM

## 2012-05-14 DIAGNOSIS — I10 Essential (primary) hypertension: Secondary | ICD-10-CM | POA: Insufficient documentation

## 2012-05-14 MED ORDER — AZITHROMYCIN 250 MG PO TABS: ORAL_TABLET | ORAL | Status: DC

## 2012-05-14 NOTE — Progress Notes (Signed)
Subjective: Patient was here last week. I intended to prescribe azithromycin for her sinus symptoms at that time, and fell to do so. She has gotten worse since then. We also treated her blood pressure she has been doing okay with it, but her nose and sinuses were congested and tender.  Objective: TMs are normal. Nose is very inflamed. Tender on sinuses. Throat clear. Neck supple without nodes. Chest clear. Heart regular without murmurs.  Assessment: Hypertension, improving control Sinusitis  Plans: Prescribed the azithromycin 200 mg, 2 initially then 1 daily for 4 days. Hopefully she'll be feeling better after that. If she is doing well acting see her back in a couple of months for recheck on blood pressure, sugar arms persist.

## 2012-05-14 NOTE — Patient Instructions (Signed)
CONTINUE SAME BLOOD PRESSURE MEDICINES  TAKE THE AZITHROMYCIN AS DIRECTED FOR SINUSES

## 2012-09-09 ENCOUNTER — Other Ambulatory Visit: Payer: Self-pay | Admitting: Family Medicine

## 2012-09-25 ENCOUNTER — Ambulatory Visit (INDEPENDENT_AMBULATORY_CARE_PROVIDER_SITE_OTHER): Payer: PRIVATE HEALTH INSURANCE | Admitting: Family Medicine

## 2012-09-25 VITALS — BP 155/94 | HR 68 | Temp 98.0°F | Resp 18 | Ht 60.5 in | Wt 196.0 lb

## 2012-09-25 DIAGNOSIS — J329 Chronic sinusitis, unspecified: Secondary | ICD-10-CM

## 2012-09-25 DIAGNOSIS — J310 Chronic rhinitis: Secondary | ICD-10-CM

## 2012-09-25 DIAGNOSIS — R040 Epistaxis: Secondary | ICD-10-CM

## 2012-09-25 MED ORDER — AMOXICILLIN 875 MG PO TABS: 875.0000 mg | ORAL_TABLET | Freq: Two times a day (BID) | ORAL | Status: DC

## 2012-09-25 MED ORDER — FLUTICASONE PROPIONATE 50 MCG/ACT NA SUSP: NASAL | Status: DC

## 2012-09-25 NOTE — Patient Instructions (Addendum)
Use Polysporin ointment, a small amount on your finger, in each side of the nose 3 or 4 times daily to keep it moist so workup inside the nose. When you get stopped having the episodes of bleeding you can discontinue this.  If you have the left upper abdominal pain, try taking a dose of MiraLax, which is a very mild laxative. Follow the directions of on the container as to how to mix it with water and drink it.  Take the antibiotic one pill twice daily. If you are not doing better let me know. Take the full course of antibiotics.

## 2012-09-25 NOTE — Progress Notes (Signed)
Subjective: Patient is here with the concern of her sinuses bothering her. It's been going on for some time. She gets a lot of bleeding from the right side of the nose. She stays very congested and has facial pain. She is getting some mucus out. Most of it runs down the back of her throat however. Her health is otherwise pretty good. She does get some pain in the left upper quadrant of her abdomen intermittently. No complaints of her bowels. She has had some epistaxis.  Objective: Pleasant elderly couple. She is non-ill appearing, moderately overweight. TMs are normal. Nose has crusting in both sides, blood in the right. Right places can be seen on the septum and turbinate. Her throat was clear. Neck supple without nodes or thyromegaly. Chest is clear to auscultation. Heart regular without murmurs. Abdomen soft without masses or tenderness  Assessment: Sinusitis/rhinitis Left upper quadrant abdominal pain  Plan: I don't feel further workup of the abdomen is indicated yet. This seems very intermittent, right at the splenic flexure area. Advise using a little MiraLax which he gets the symptoms to see a she does.  Use the Polysporin ointment in her nose  C. instructions  Return as needed.

## 2012-10-18 ENCOUNTER — Other Ambulatory Visit: Payer: Self-pay | Admitting: Physician Assistant

## 2012-10-19 ENCOUNTER — Other Ambulatory Visit: Payer: Self-pay | Admitting: Family Medicine

## 2012-10-20 IMAGING — US US SOFT TISSUE HEAD/NECK
1 series · 14 of 25 positions shown · non-contrast
Comparison: None.

CLINICAL DATA: Low TSH

THYROID ULTRASOUND
TECHNIQUE: Ultrasound examination of the thyroid gland and adjacent
soft tissues was performed.

[Series 1: us soft tissue head/neck · 0.08mm/px · 14 of 39 slices shown]
[im 1/39]
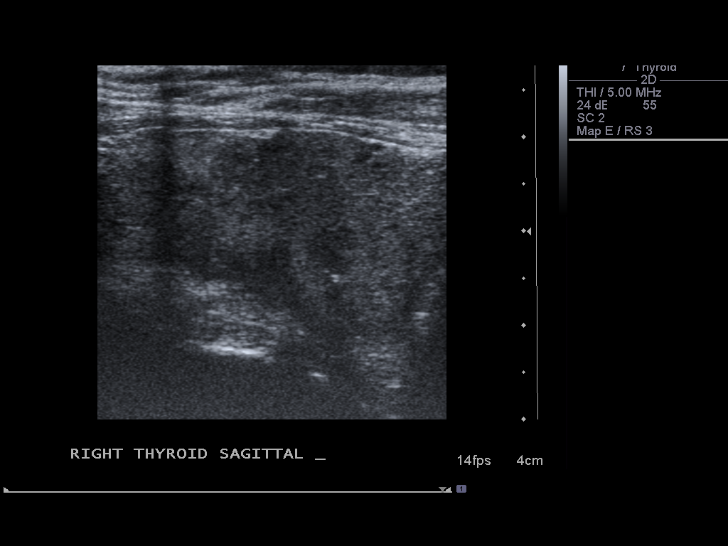
[im 4/39]
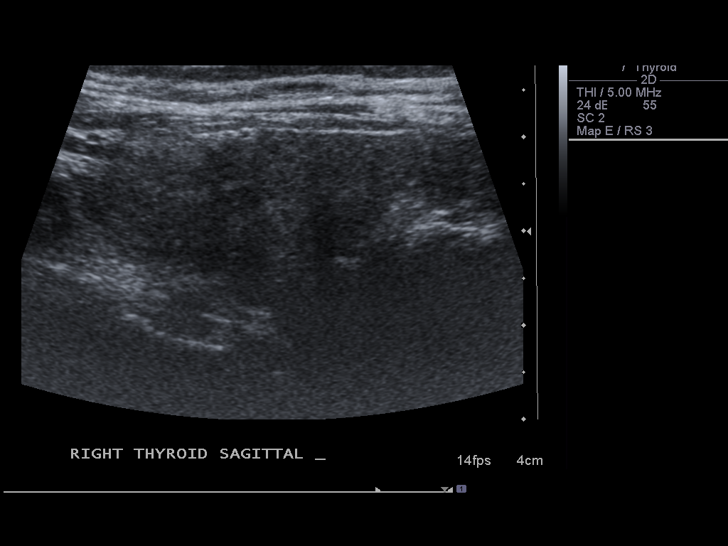
[im 7/39]
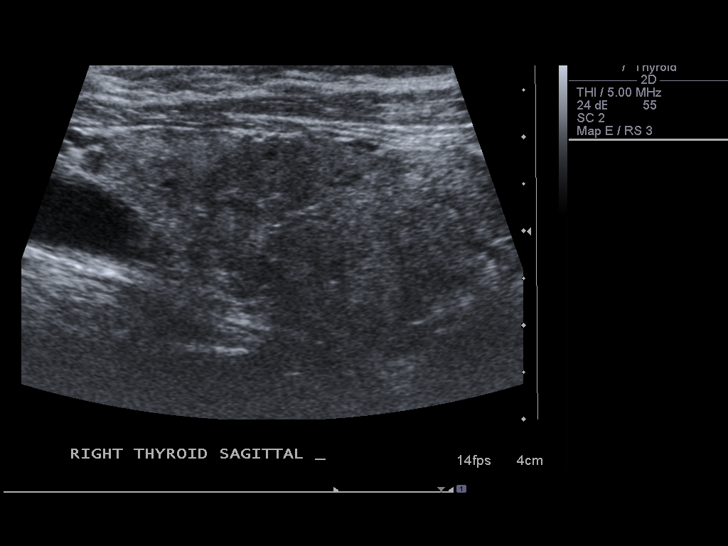
[im 10/39]
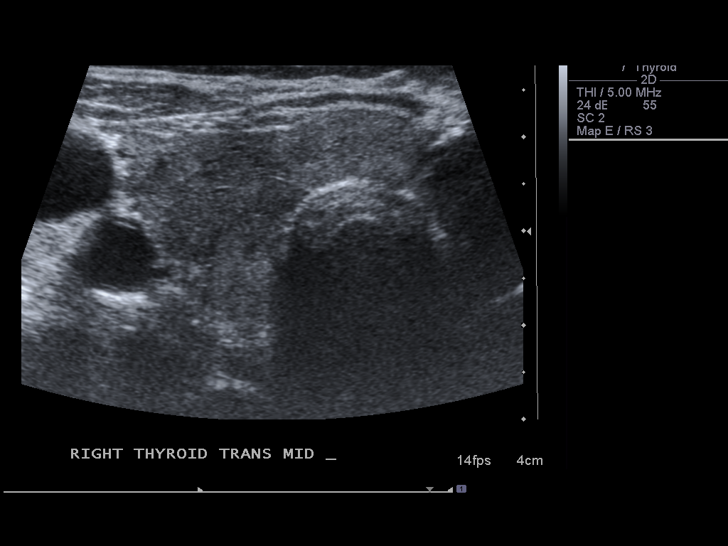
[im 13/39]
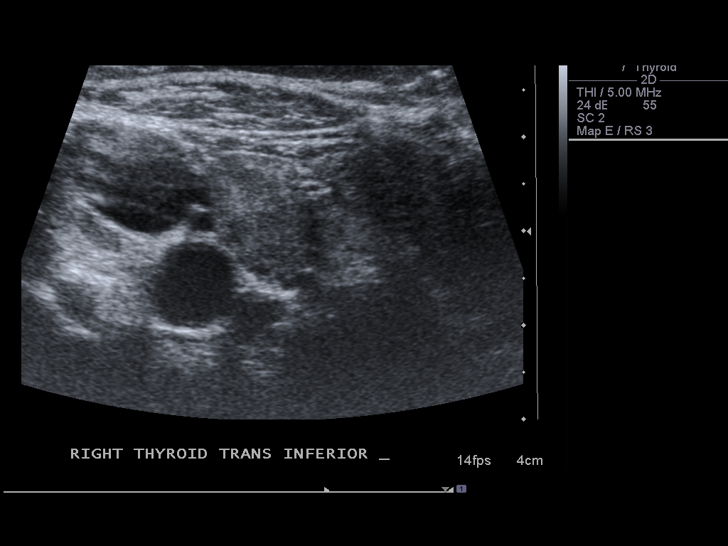
[im 15/39]
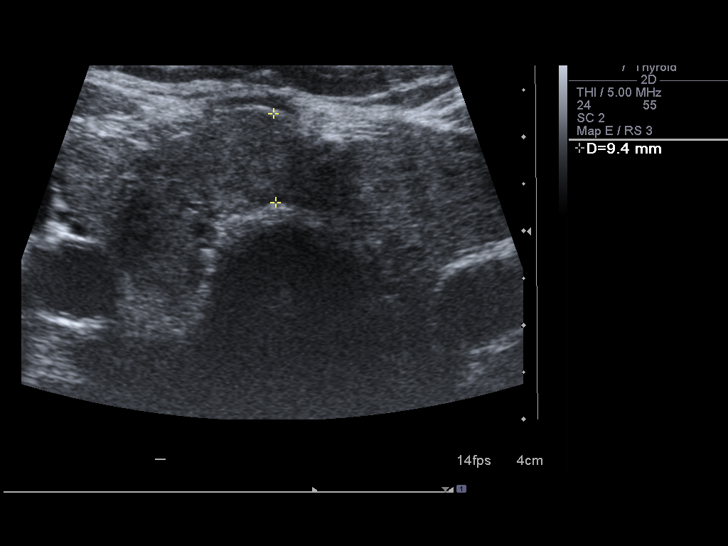
[im 18/39]
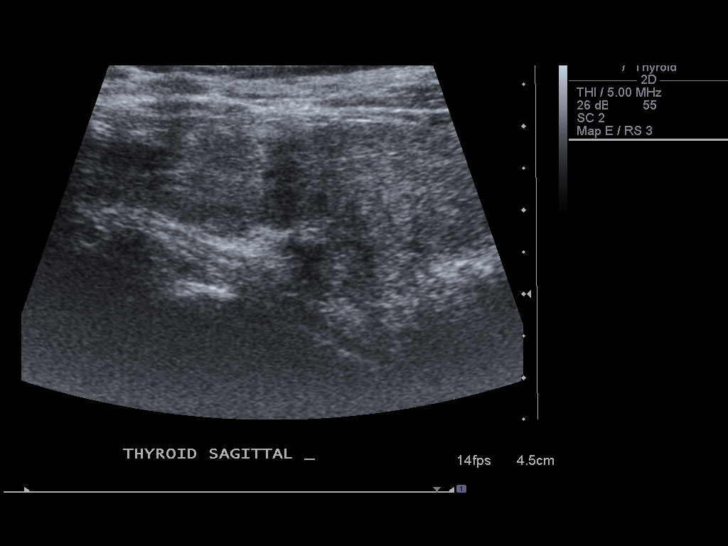
[im 21/39]
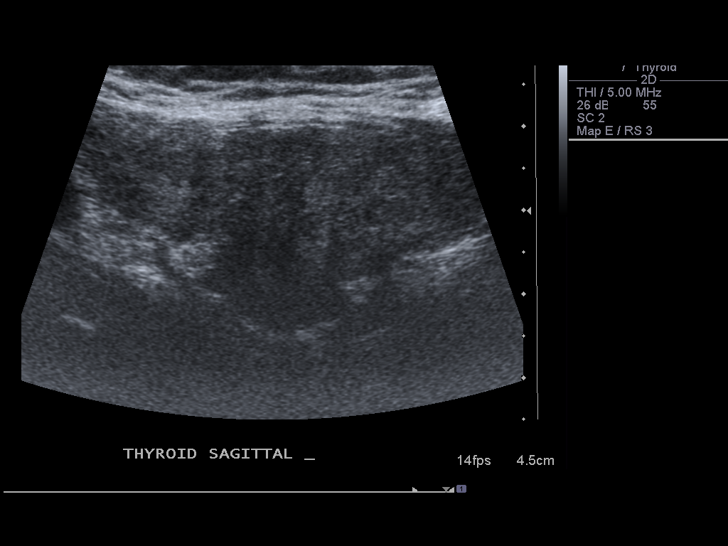
[im 24/39]
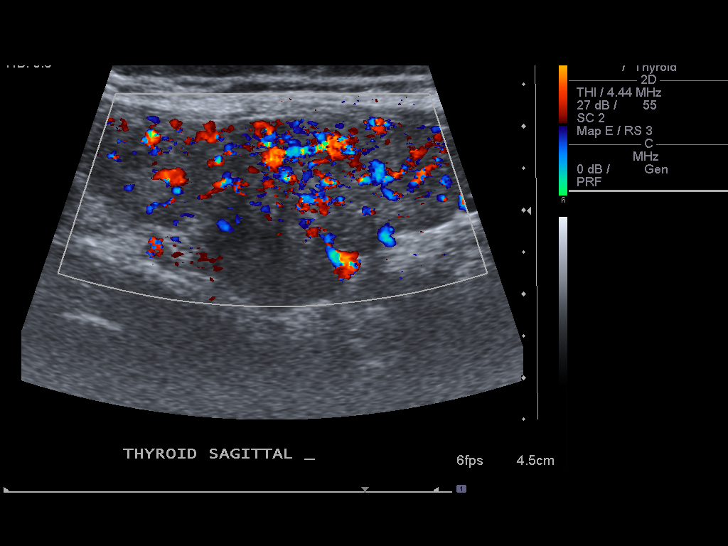
[im 26/39]
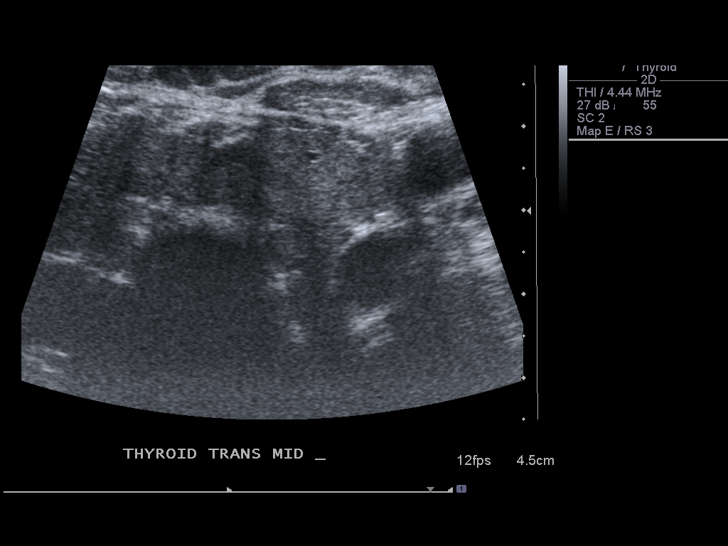
[im 29/39]
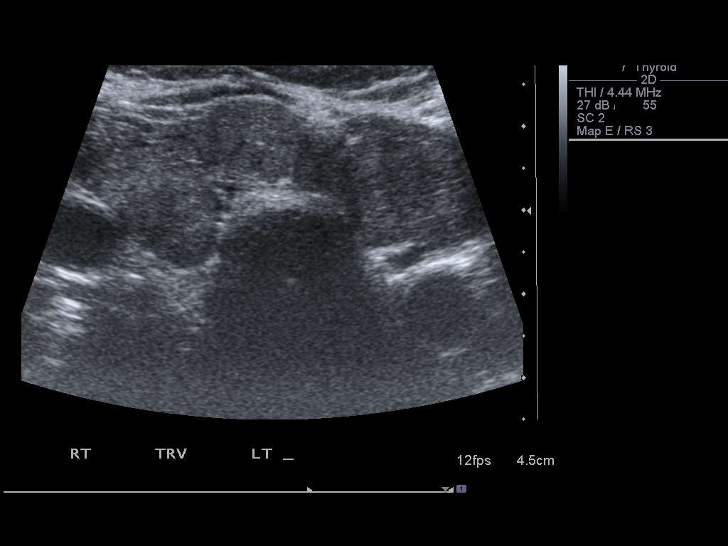
[im 32/39]
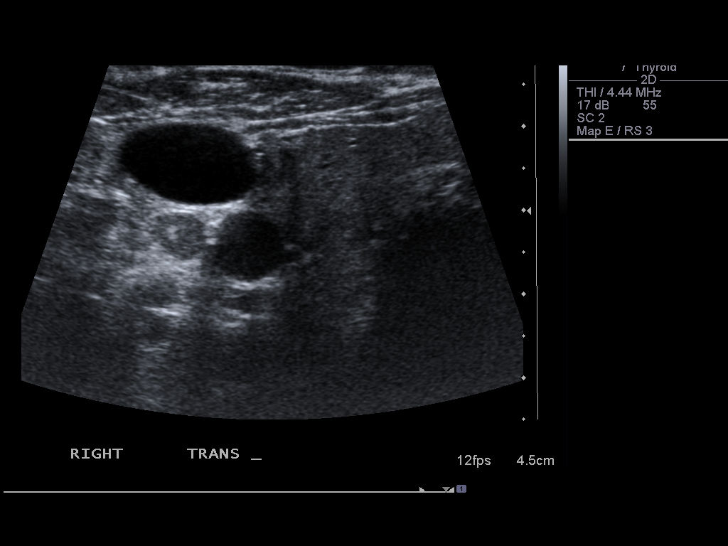
[im 35/39]
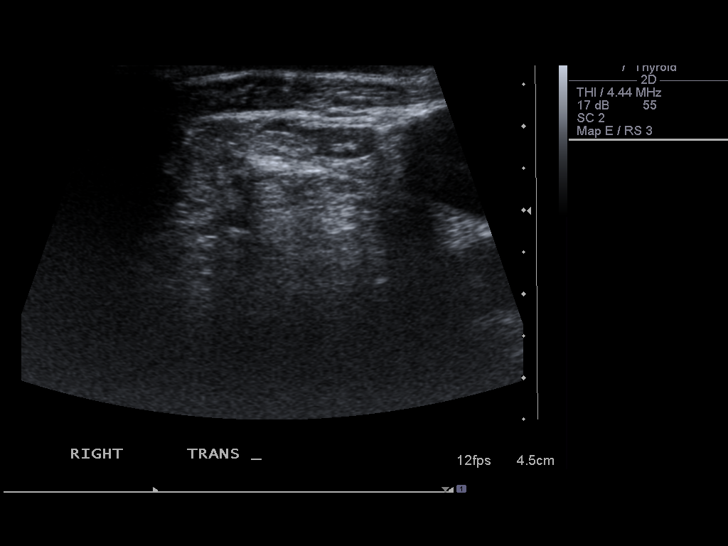
[im 39/39]
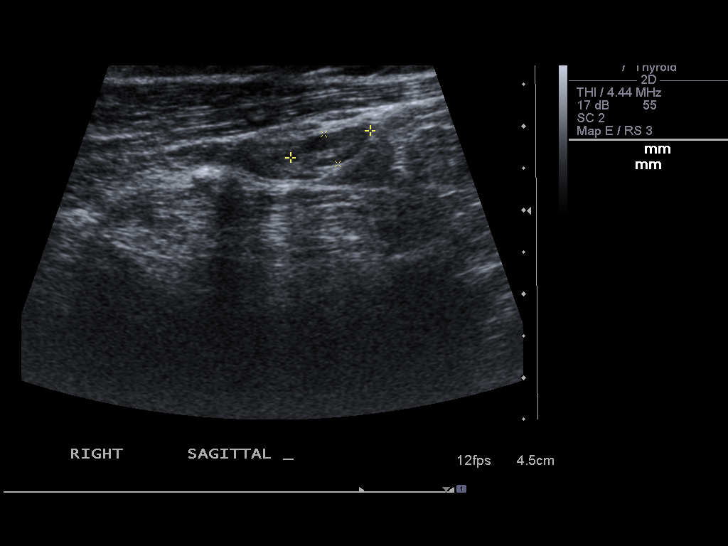

[14 of 25 positions shown; findings below may reference images not displayed]

FINDINGS: Right thyroid lobe:  Measures 4.7 x 1.8 x 2.0 cm.  Heterogeneous
appearance without discrete nodules.  Hypervascular.
Left thyroid lobe:  Measures 5.0 x 2.5 x 2.0 cm.  Heterogeneous
appearance without discrete nodules.  Hypervascular.
Isthmus:  Measures 9 mm in thickness.

Focal nodules:  Marked heterogeneity without discrete nodules.

Lymphadenopathy:  No abnormal lymph nodes visualized.
IMPRESSION: Hypervascular, heterogeneous appearance of the thyroid, suggestive
of thyroiditis.

## 2012-11-16 ENCOUNTER — Other Ambulatory Visit: Payer: Self-pay | Admitting: Family Medicine

## 2012-11-20 ENCOUNTER — Telehealth: Payer: Self-pay

## 2012-11-20 MED ORDER — FAMOTIDINE 20 MG PO TABS: 20.0000 mg | ORAL_TABLET | Freq: Two times a day (BID) | ORAL | Status: DC

## 2012-11-20 NOTE — Telephone Encounter (Signed)
Pt called and stated that her pharmacy told her to call us to get RF of Pepcid. I advised pt that she should plan to RTC for f/up w/in a couple of months but that I can send in RF for her. Pt agreed.

## 2012-12-05 IMAGING — CR DG CHEST 2V
2 series · 2 of 2 positions shown · non-contrast
Comparison: 06/03/2011

CLINICAL DATA: 79-year-old female with chest pain.

CHEST - 2 VIEW

[w chest lat]
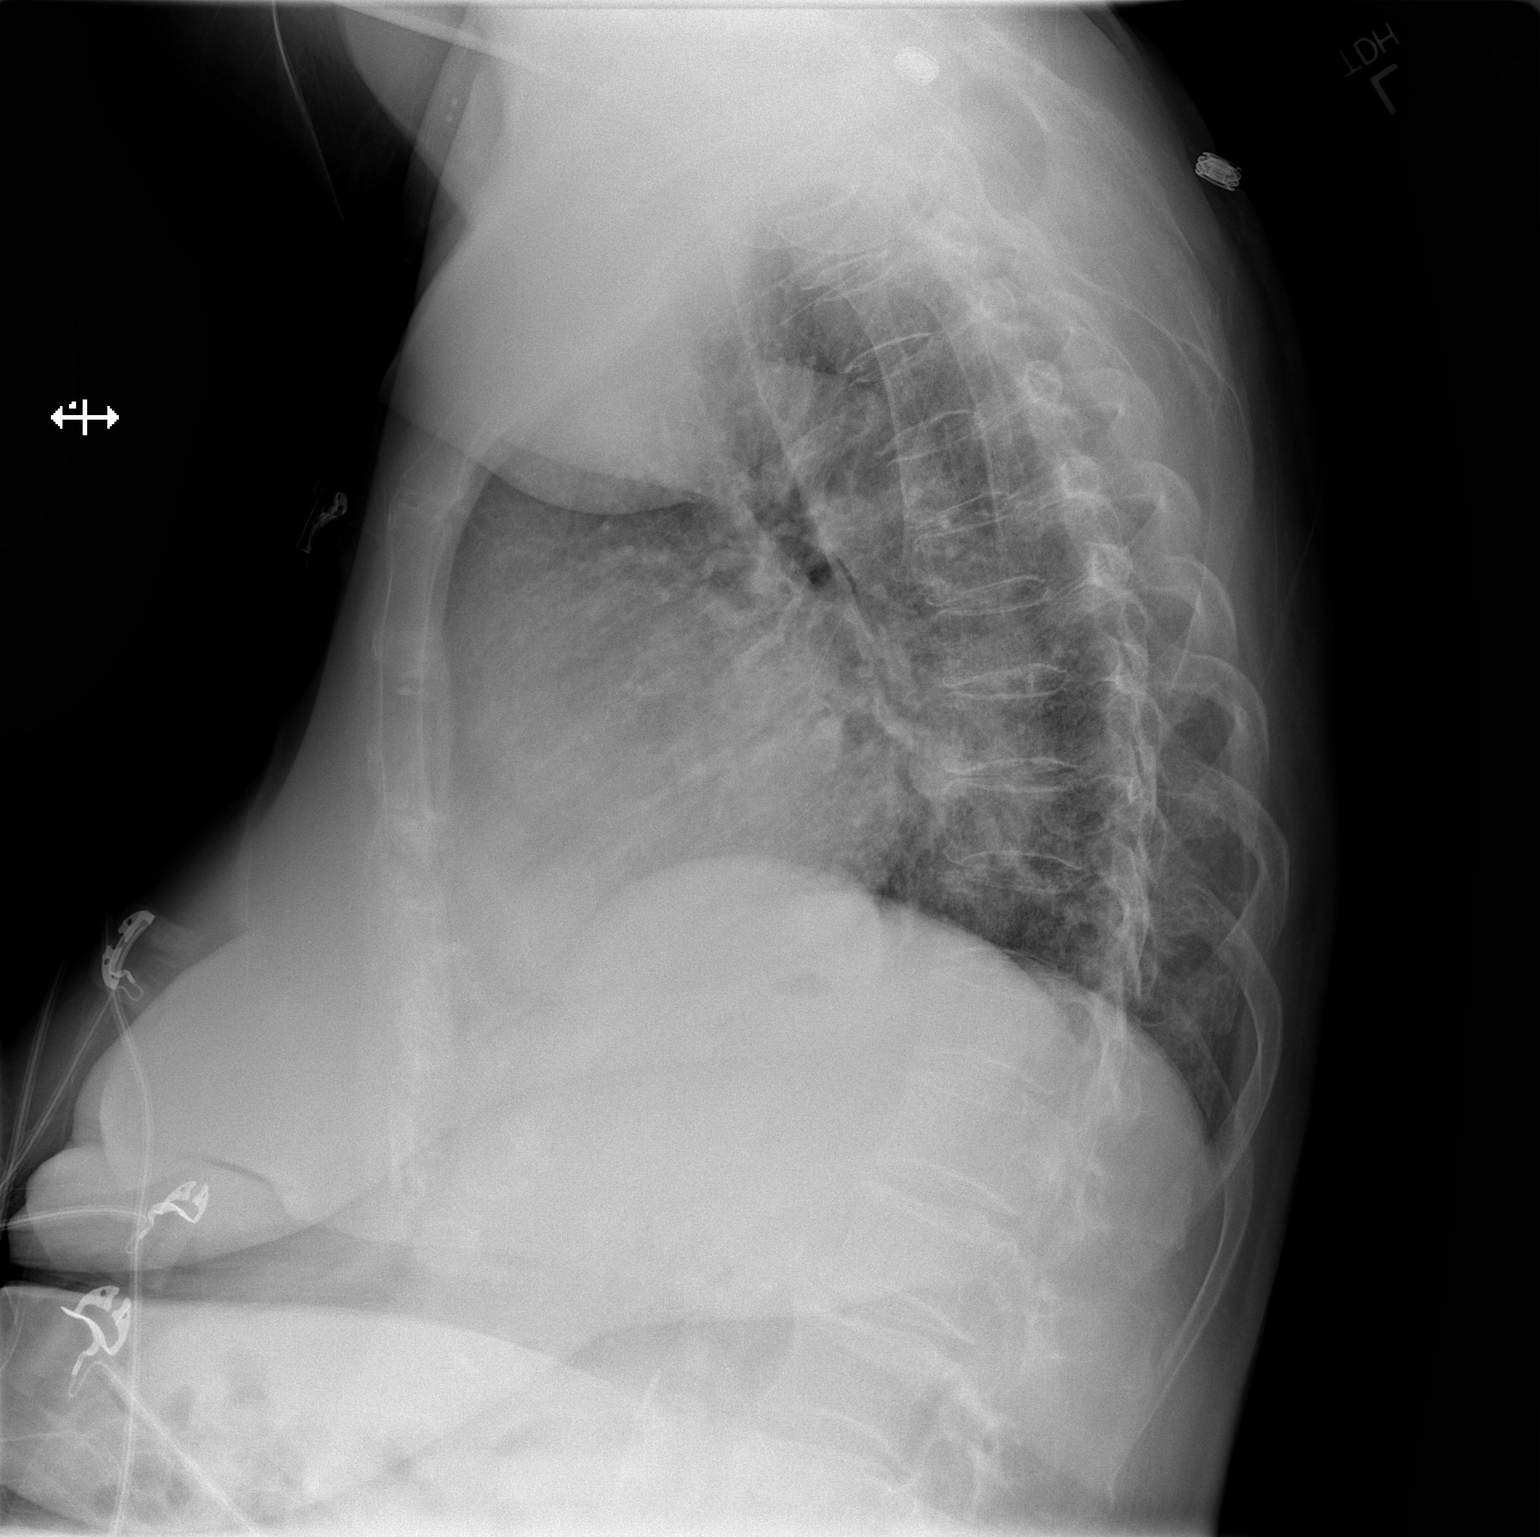

[x chest ap]
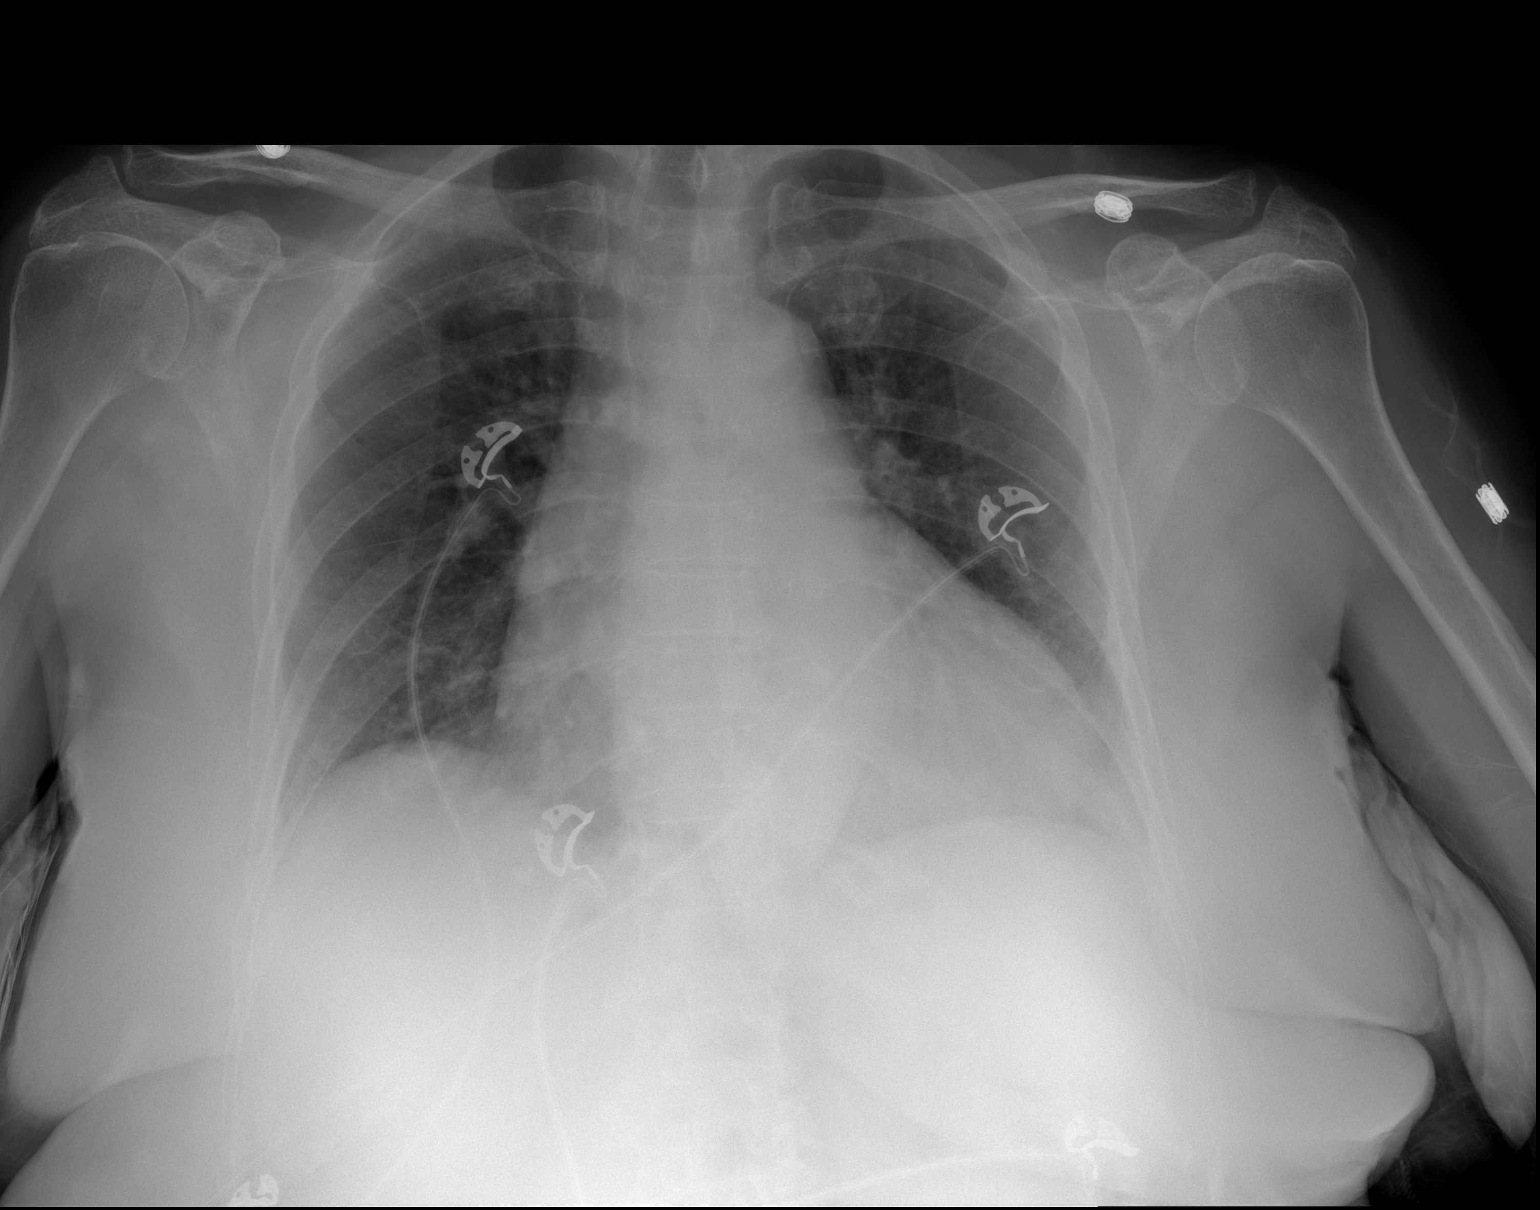

[2 of 2 positions shown; findings below may reference images not displayed]

FINDINGS: Cardiomegaly is identified.
Mild peribronchial thickening is stable.
There is no evidence of focal airspace disease, pulmonary edema,
pulmonary nodule/mass, pleural effusion, or pneumothorax.
No acute bony abnormalities are identified.
IMPRESSION: Cardiomegaly without evidence of acute cardiopulmonary disease.

## 2012-12-05 IMAGING — CR DG CERVICAL SPINE COMPLETE 4+V
5 series · 5 of 5 positions shown · non-contrast
Comparison: None

CLINICAL DATA: Pain

CERVICAL SPINE - COMPLETE 4+ VIEW

[w c-spine lat]
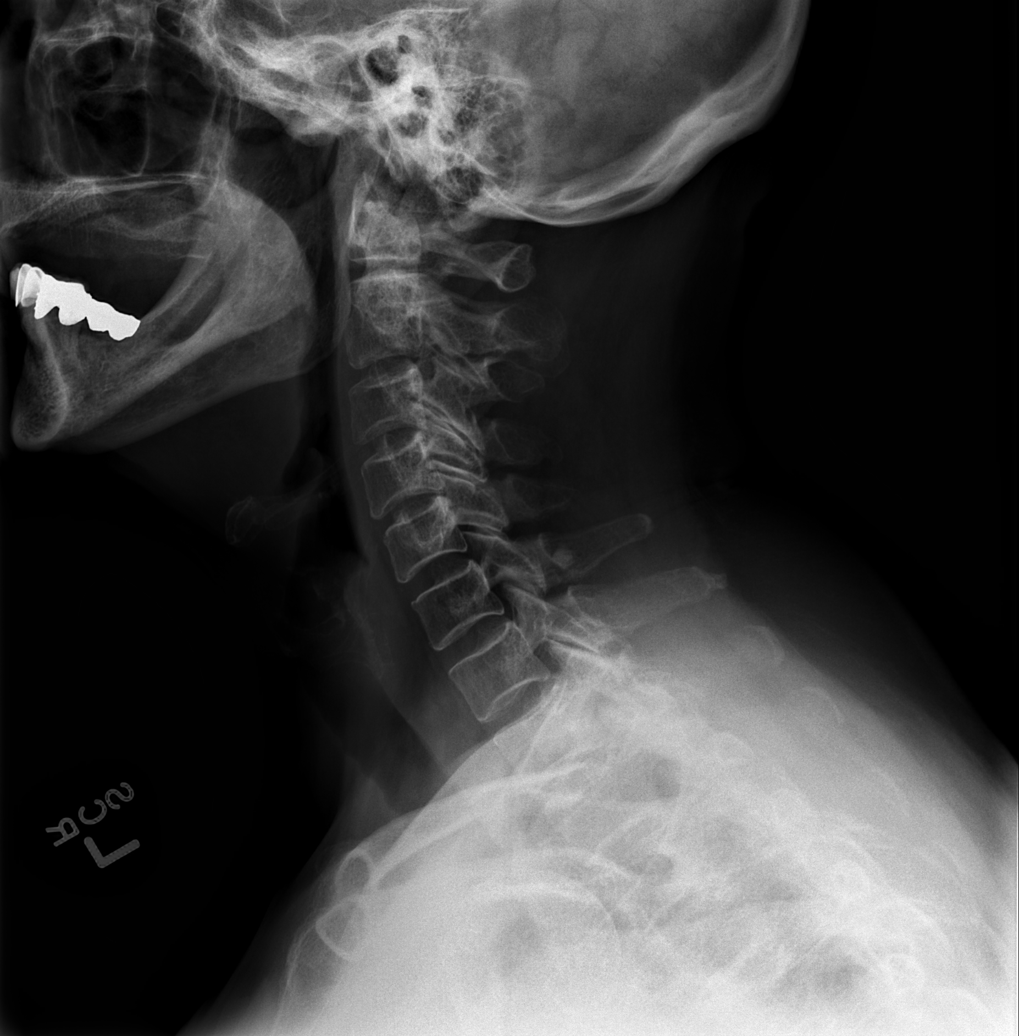

[w c-spine oblique (1 of 2)]
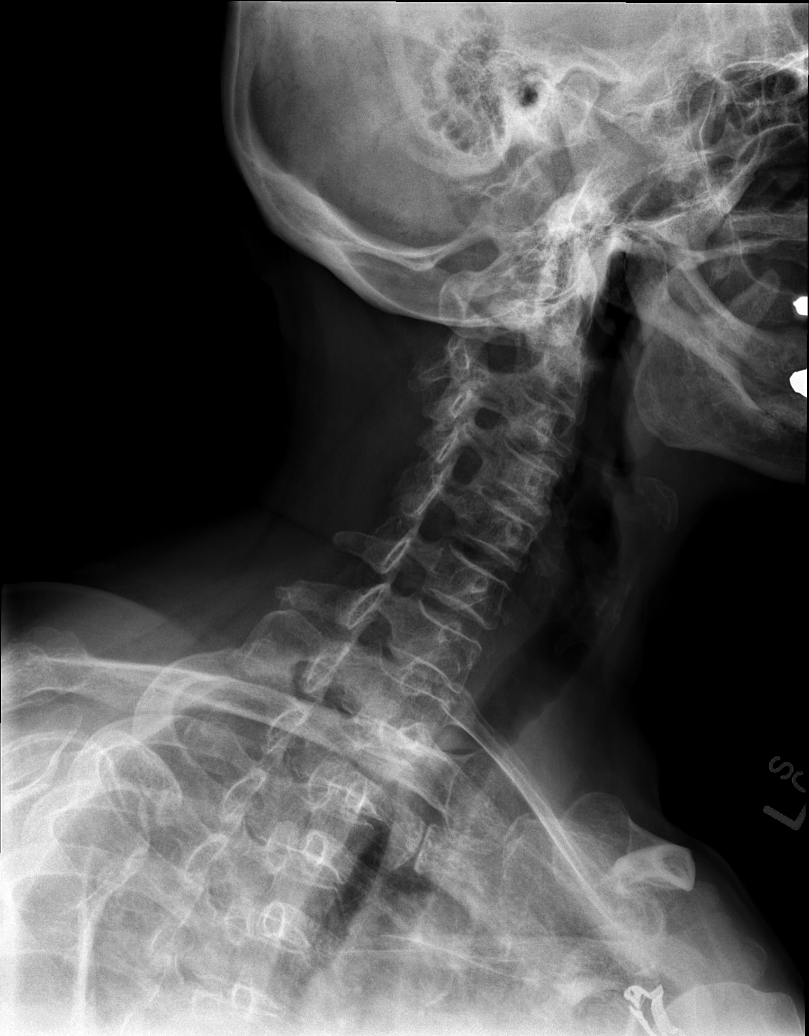

[w c-spine oblique (2 of 2)]
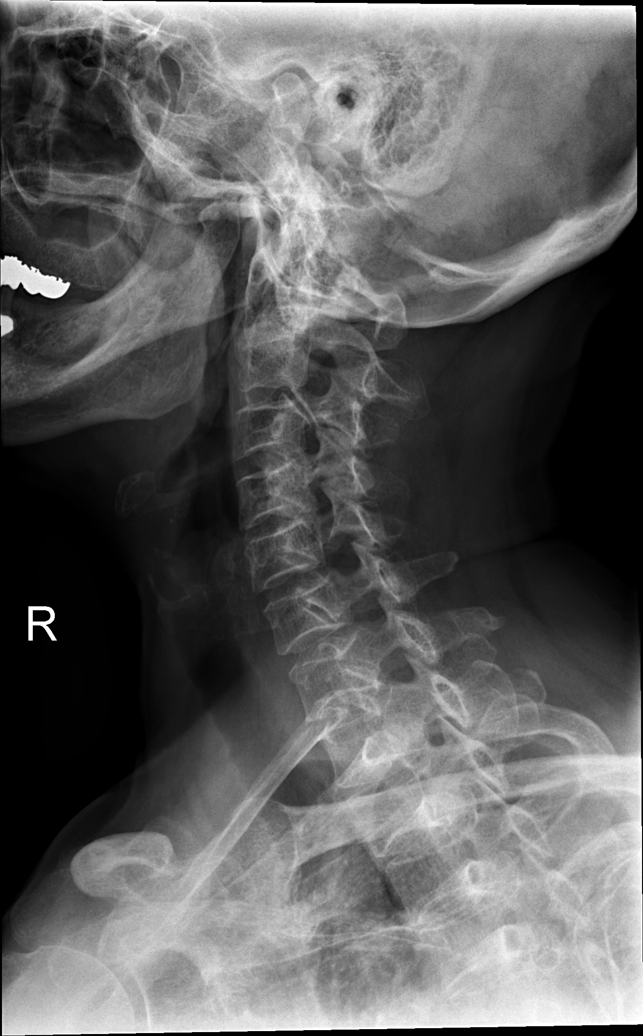

[w c-spine a.p.]
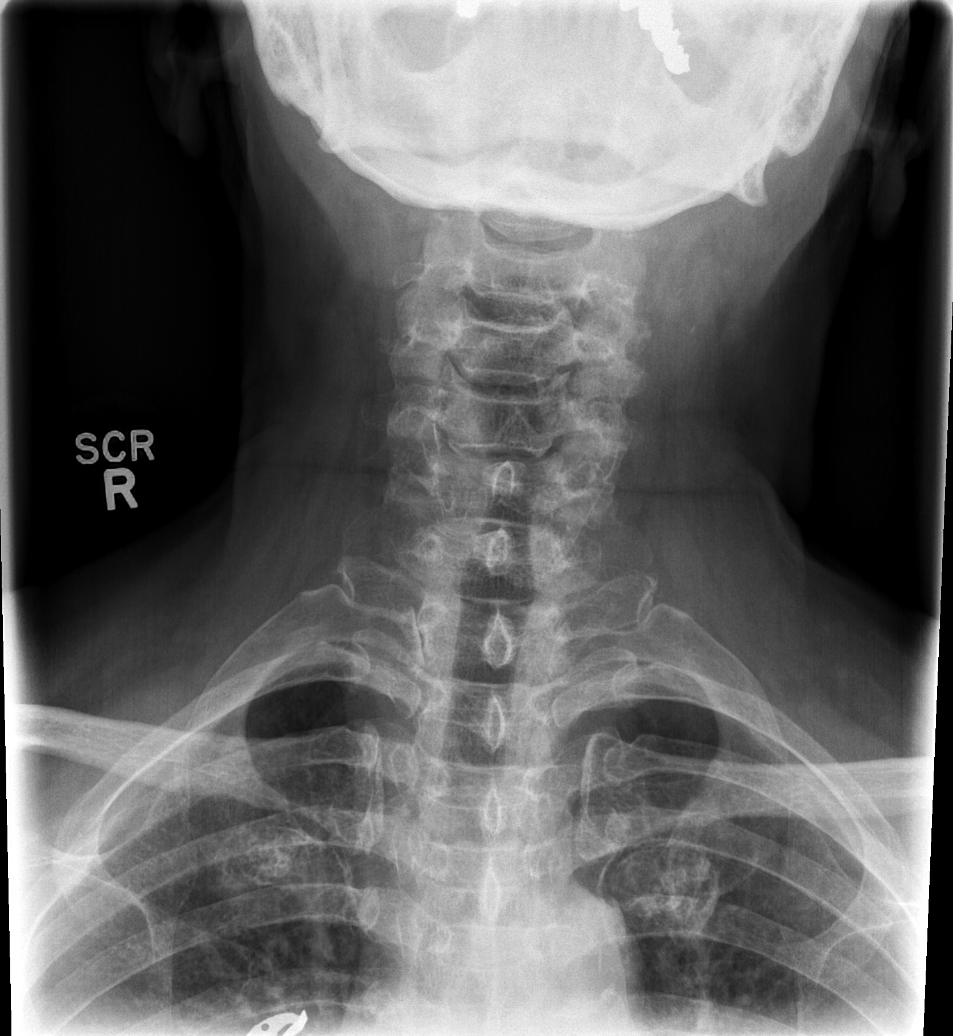

[w c-spine odontoid]
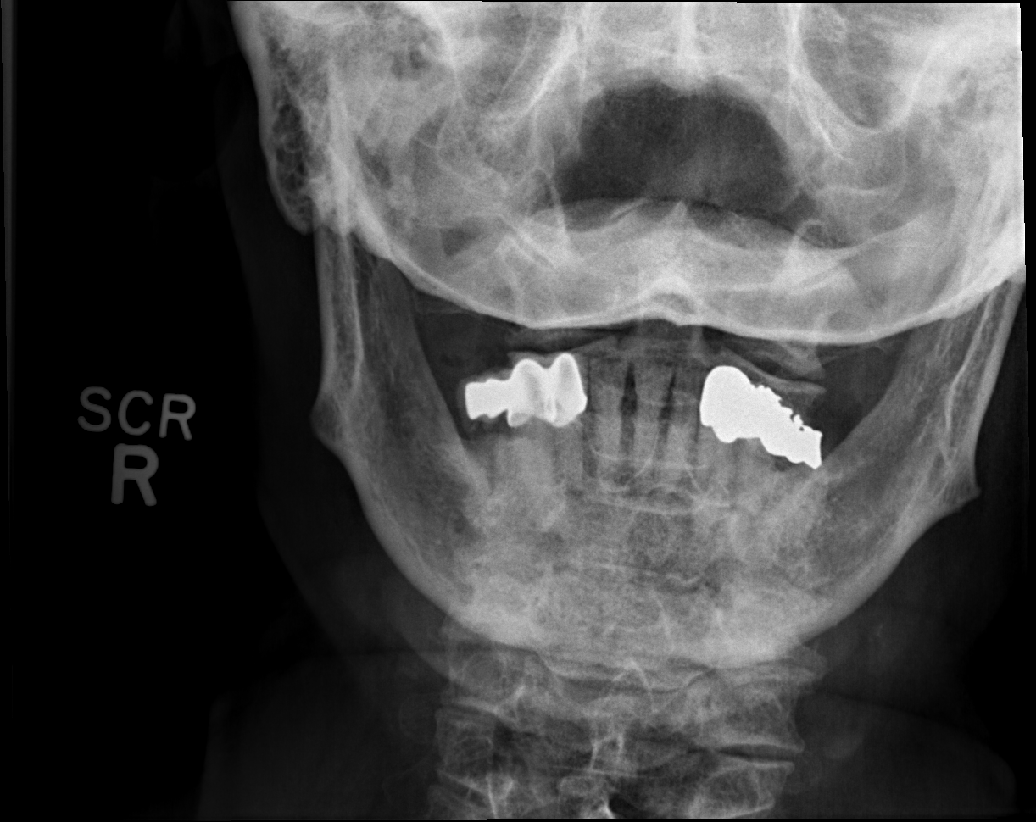

[5 of 5 positions shown; findings below may reference images not displayed]

FINDINGS: There is no evidence of cervical spine fracture.
Alignment is normal.  Intervertebral disc spaces are maintained.
IMPRESSION: Negative exam.

## 2012-12-05 IMAGING — CR DG SHOULDER 1V*L*
3 series · 3 of 3 positions shown · non-contrast
Comparison: None.

CLINICAL DATA: Left posterior shoulder pain.

PORTABLE LEFT SHOULDER - 2+ VIEW

[AP (1 of 3)]
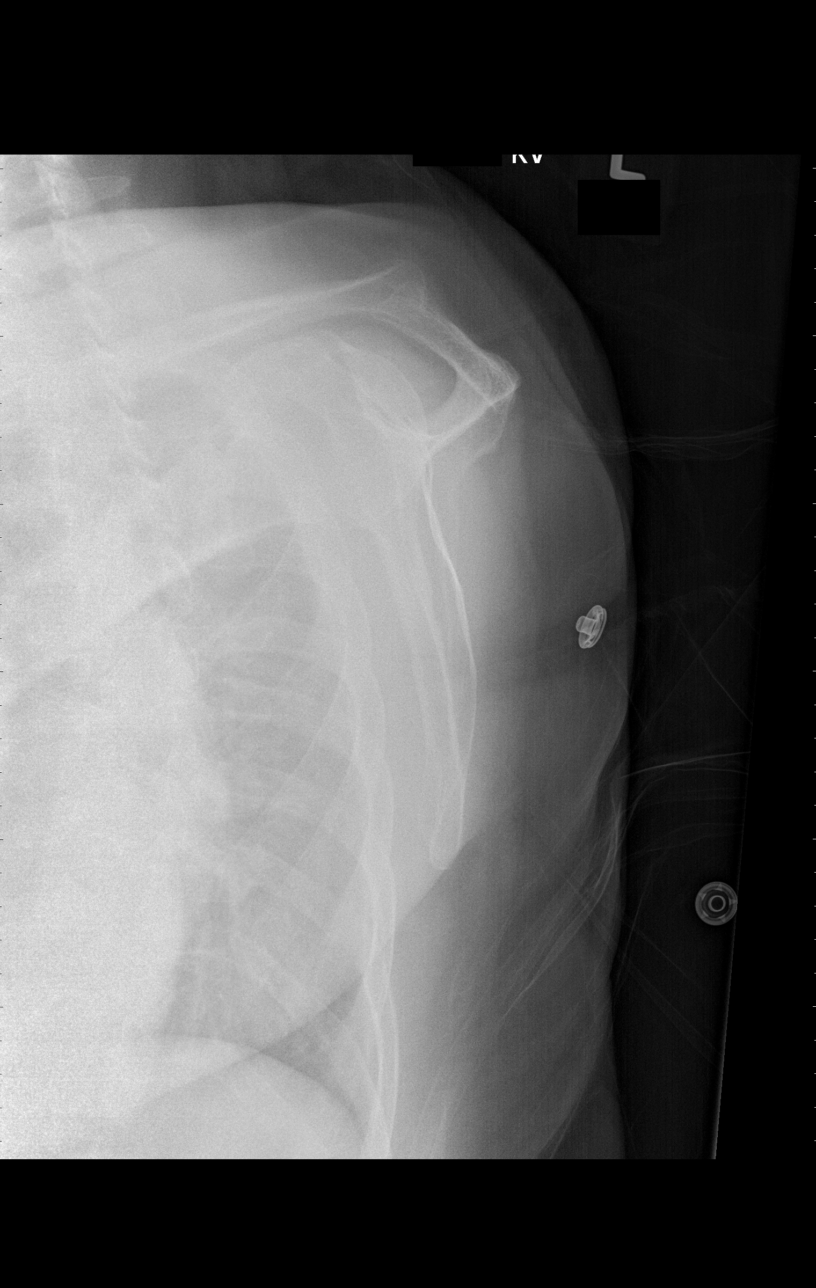

[AP (2 of 3)]
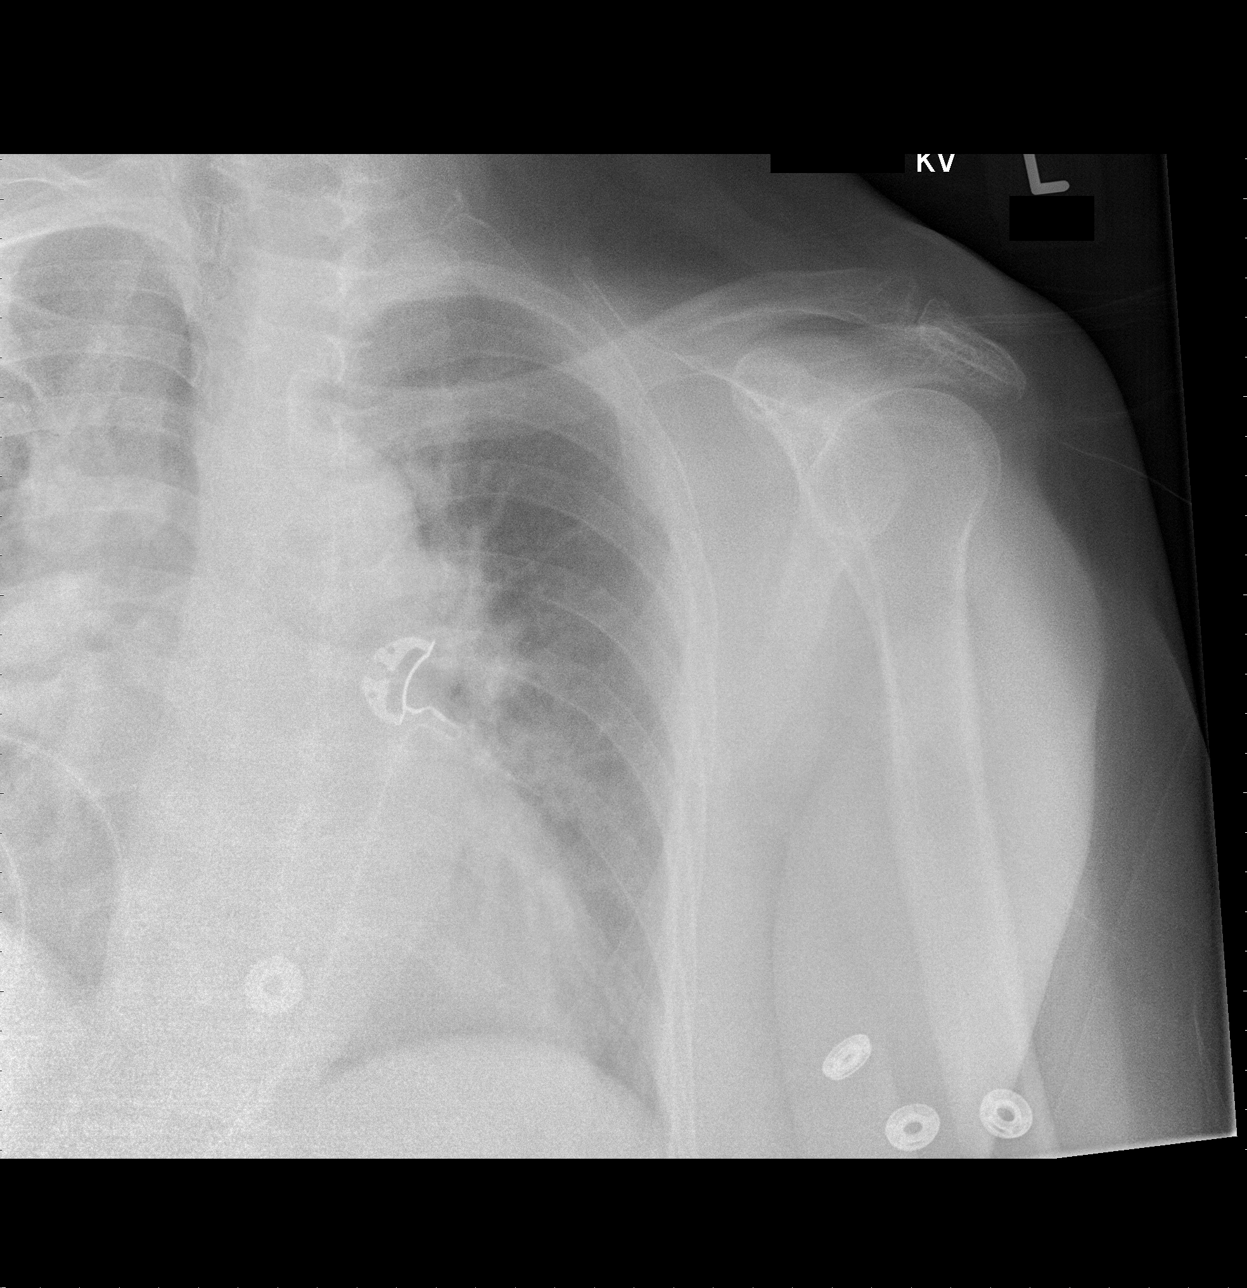

[AP (3 of 3)]
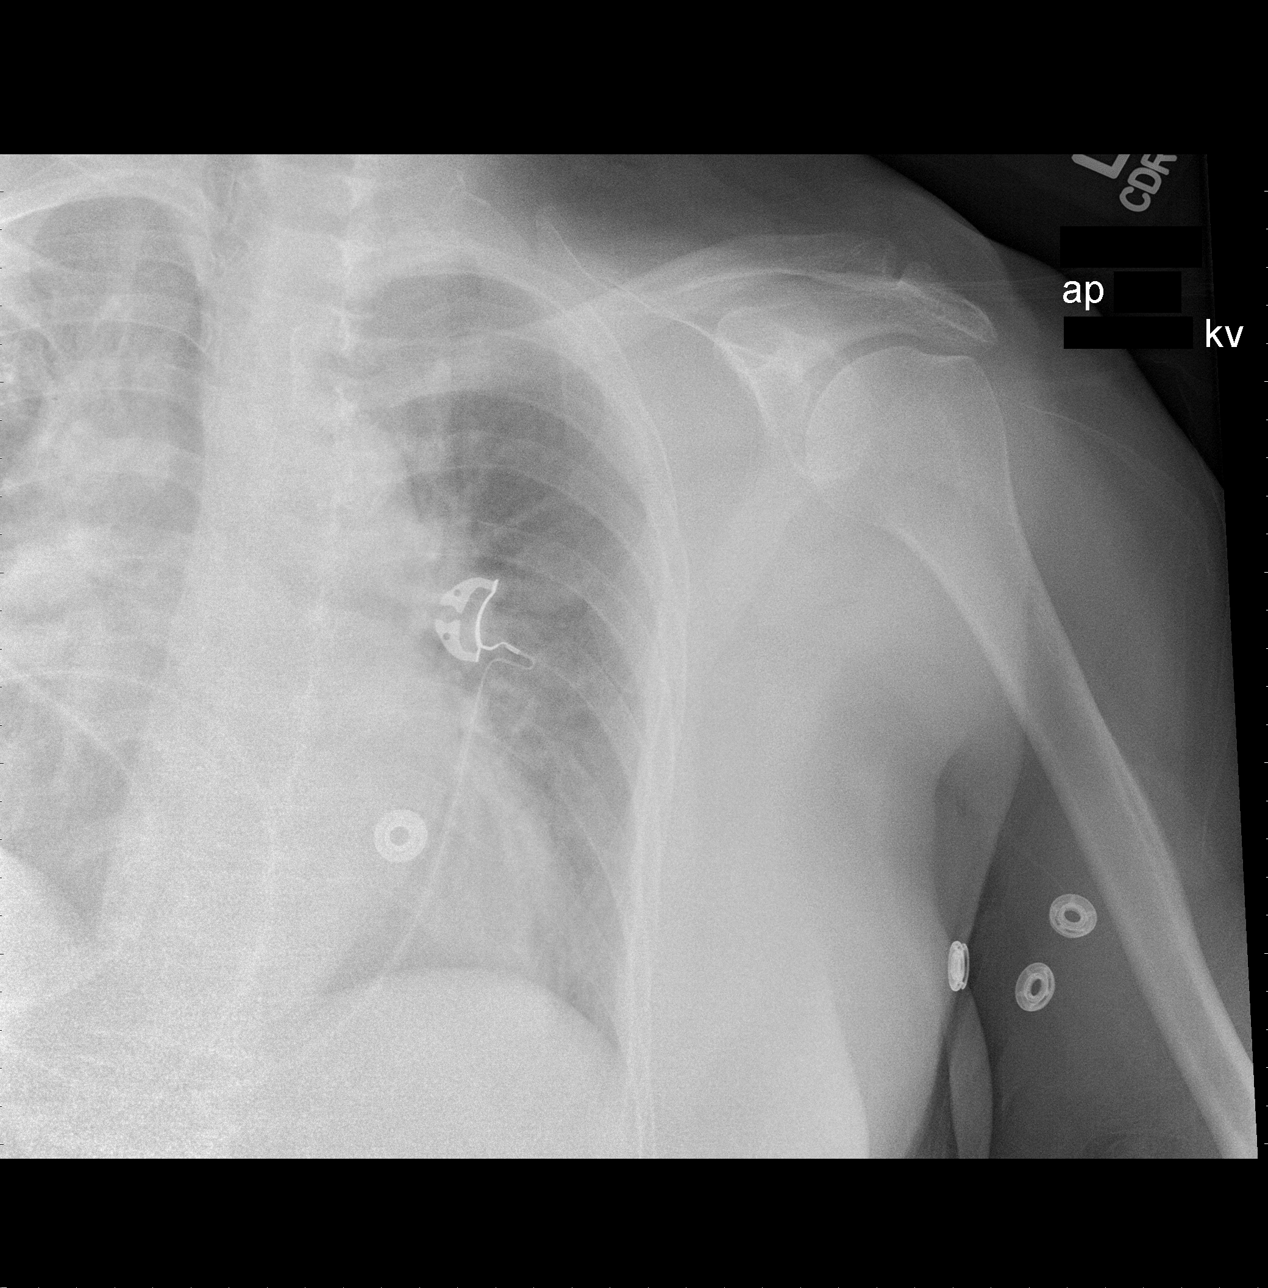

[3 of 3 positions shown; findings below may reference images not displayed]

FINDINGS: Mild acromioclavicular DJD.  The glenohumeral joint is
within normal limits.  No fracture or dislocation identified.  No
aggressive osseous lesion.  Partially imaged cardiomegaly with
central vascular congestion.
IMPRESSION: Mild AC joint DJD.  No acute osseous abnormality identified.

## 2012-12-31 ENCOUNTER — Other Ambulatory Visit: Payer: Self-pay | Admitting: Family Medicine

## 2012-12-31 NOTE — Telephone Encounter (Signed)
Needs ov, labs 2nd notice 

## 2013-01-12 ENCOUNTER — Other Ambulatory Visit: Payer: Self-pay | Admitting: Physician Assistant

## 2013-01-31 ENCOUNTER — Other Ambulatory Visit: Payer: Self-pay | Admitting: Physician Assistant

## 2013-02-25 ENCOUNTER — Other Ambulatory Visit: Payer: Self-pay | Admitting: Physician Assistant

## 2013-03-01 ENCOUNTER — Other Ambulatory Visit: Payer: Self-pay | Admitting: Physician Assistant

## 2013-03-08 ENCOUNTER — Other Ambulatory Visit: Payer: Self-pay | Admitting: Physician Assistant

## 2013-04-07 ENCOUNTER — Other Ambulatory Visit: Payer: Self-pay | Admitting: Physician Assistant

## 2013-04-22 ENCOUNTER — Other Ambulatory Visit: Payer: Self-pay | Admitting: Family Medicine

## 2013-04-22 NOTE — Telephone Encounter (Signed)
Needs visit, 3rd notice

## 2013-05-03 ENCOUNTER — Other Ambulatory Visit: Payer: Self-pay | Admitting: Family Medicine

## 2013-05-16 ENCOUNTER — Other Ambulatory Visit: Payer: Self-pay

## 2014-03-26 ENCOUNTER — Other Ambulatory Visit: Payer: Self-pay | Admitting: *Deleted

## 2014-08-08 ENCOUNTER — Ambulatory Visit (INDEPENDENT_AMBULATORY_CARE_PROVIDER_SITE_OTHER): Payer: PRIVATE HEALTH INSURANCE | Admitting: Family Medicine

## 2014-08-08 VITALS — BP 140/90 | HR 93 | Temp 98.1°F | Resp 16 | Ht 61.5 in | Wt 198.0 lb

## 2014-08-08 DIAGNOSIS — J0110 Acute frontal sinusitis, unspecified: Secondary | ICD-10-CM

## 2014-08-08 MED ORDER — AMOXICILLIN 875 MG PO TABS: 875.0000 mg | ORAL_TABLET | Freq: Two times a day (BID) | ORAL | Status: DC

## 2014-08-08 NOTE — Progress Notes (Signed)
[facility] [address], [place] [id number] [phone]  Date:  08/08/2014   Name:  Audrey Burke   DOB:  07/11/1932   MRN:  [id number]  PCP:  Georgann HousekeeperHUSAIN,KARRAR, MD    Chief Complaint: Generalized Body Aches; Otalgia; and Sinus Problem   History of Present Illness:  Audrey Burke is a 80 y.o. very pleasant female patient who presents with the following:  She is here today with illness for 4-5 days.  She notes sinus congestion, ears hurting, body aches.  She thinks she has a sinus infection No cough.  No GI symptoms.  No sore throat She has been seen by Dr. Alwyn RenHopper a couple of years ago with similar sx and did well with amoxicillin. She would like this again if possible She has not noted any fever  Patient Active Problem List   Diagnosis Date Noted  . HTN (hypertension) 05/14/2012  . Chronic pain 05/14/2012    Past Medical History  Diagnosis Date  . Hypertension     No past surgical history on file.  History  Substance Use Topics  . Smoking status: Never Smoker   . Smokeless tobacco: Not on file  . Alcohol Use: Not on file    No family history on file.  No Known Allergies  Medication list has been reviewed and updated.  Current Outpatient Prescriptions on File Prior to Visit  Medication Sig Dispense Refill  . amLODipine (NORVASC) 5 MG tablet Take 1 tablet (5 mg total) by mouth daily. PATIENT NEEDS OFFICE VISIT FOR ADDITIONAL REFILLS - 2nd NOTICE 15 tablet 0  . famotidine (PEPCID) 20 MG tablet Take 1 tablet (20 mg total) by mouth 2 (two) times daily. NEED VISIT! 60 tablet 0  . fluticasone (FLONASE) 50 MCG/ACT nasal spray Use 1-2 sprays each nostril daily if needed. 16 g 5  . furosemide (LASIX) 20 MG tablet Take 20 mg by mouth 2 (two) times daily.    Marland Kitchen. gabapentin (NEURONTIN) 400 MG capsule Take 400 mg by mouth daily.    . metoprolol succinate (TOPROL-XL) 50 MG 24 hr tablet Take 1 tablet (50 mg total) by mouth daily. PATIENT NEEDS  OFFICE VISIT FOR ADDITIONAL REFILLS - 2nd NOTICE 15 tablet 0  . olmesartan (BENICAR) 40 MG tablet Take 1 tablet (40 mg total) by mouth daily. Needs office visit-no further refills 5 tablet 0  . Rivaroxaban (XARELTO) 20 MG TABS Take 20 mg by mouth daily.     No current facility-administered medications on file prior to visit.    Review of Systems:  As per HPI- otherwise negative.   Physical Examination: Filed Vitals:   08/08/14 1632  BP: 160/92  Pulse: 93  Temp: 98.1 F (36.7 C)  Resp: 16   Filed Vitals:   08/08/14 1632  Height: 5' 1.5" (1.562 m)  Weight: 198 lb (89.812 kg)   Body mass index is 36.81 kg/(m^2). Ideal Body Weight: Weight in (lb) to have BMI = 25: 134.2  GEN: WDWN, NAD, Non-toxic, A & O x 3, obese, looks well HEENT: Atraumatic, Normocephalic. Neck supple. No masses, No LAD. Bilateral TM wnl, oropharynx normal.  PEERL,EOMI.  Sinus tenderness and nasal cavity inflammation Ears and Nose: No external deformity. CV: RRR, No M/G/R. No JVD. No thrill. No extra heart sounds. PULM: CTA B, no wheezes, crackles, rhonchi. No retractions. No resp. distress. No accessory muscle use. EXTR: No c/c/e NEURO Normal gait.  PSYCH: Normally interactive. Conversant. Not depressed or anxious appearing.  Calm demeanor.  Assessment and Plan: Acute frontal sinusitis, recurrence not specified - Plan: amoxicillin (AMOXIL) 875 MG tablet  Here today with a recurrent sinus infection.  Did well with amox in the past, will treat with same. She will let us know if not better soon- Sooner if worse.     Signed Abbe AmsterdamJessica Makia Bossi, MD

## 2014-08-08 NOTE — Patient Instructions (Signed)
We are going to treat you with amoxicillin (antiboitc) for a sinus infection.  Let me know if you do not feel better in the next few days.

## 2014-08-09 ENCOUNTER — Other Ambulatory Visit: Payer: Self-pay | Admitting: Family Medicine

## 2014-09-11 DIAGNOSIS — Z961 Presence of intraocular lens: Secondary | ICD-10-CM | POA: Diagnosis not present

## 2014-09-23 DIAGNOSIS — N812 Incomplete uterovaginal prolapse: Secondary | ICD-10-CM | POA: Diagnosis not present

## 2014-09-24 DIAGNOSIS — D510 Vitamin B12 deficiency anemia due to intrinsic factor deficiency: Secondary | ICD-10-CM | POA: Diagnosis not present

## 2014-10-27 DIAGNOSIS — D510 Vitamin B12 deficiency anemia due to intrinsic factor deficiency: Secondary | ICD-10-CM | POA: Diagnosis not present

## 2015-05-12 DIAGNOSIS — Z79899 Other long term (current) drug therapy: Secondary | ICD-10-CM | POA: Diagnosis not present

## 2015-05-12 DIAGNOSIS — J069 Acute upper respiratory infection, unspecified: Secondary | ICD-10-CM | POA: Diagnosis not present

## 2015-05-12 DIAGNOSIS — E0590 Thyrotoxicosis, unspecified without thyrotoxic crisis or storm: Secondary | ICD-10-CM | POA: Diagnosis not present

## 2015-05-19 ENCOUNTER — Encounter (HOSPITAL_COMMUNITY): Payer: Self-pay | Admitting: Emergency Medicine

## 2015-05-19 ENCOUNTER — Emergency Department (HOSPITAL_COMMUNITY)
Admission: EM | Admit: 2015-05-19 | Discharge: 2015-05-19 | Disposition: A | Discharge status: Home / Self Care | Payer: Medicare Other | Attending: Emergency Medicine | Admitting: Emergency Medicine

## 2015-05-19 DIAGNOSIS — M542 Cervicalgia: Secondary | ICD-10-CM | POA: Diagnosis not present

## 2015-05-19 DIAGNOSIS — I10 Essential (primary) hypertension: Secondary | ICD-10-CM | POA: Diagnosis not present

## 2015-05-19 DIAGNOSIS — Z792 Long term (current) use of antibiotics: Secondary | ICD-10-CM | POA: Insufficient documentation

## 2015-05-19 DIAGNOSIS — M791 Myalgia: Secondary | ICD-10-CM | POA: Diagnosis present

## 2015-05-19 DIAGNOSIS — M546 Pain in thoracic spine: Secondary | ICD-10-CM | POA: Insufficient documentation

## 2015-05-19 DIAGNOSIS — Z7901 Long term (current) use of anticoagulants: Secondary | ICD-10-CM | POA: Diagnosis not present

## 2015-05-19 DIAGNOSIS — R52 Pain, unspecified: Secondary | ICD-10-CM

## 2015-05-19 DIAGNOSIS — R51 Headache: Secondary | ICD-10-CM | POA: Diagnosis not present

## 2015-05-19 DIAGNOSIS — Z79899 Other long term (current) drug therapy: Secondary | ICD-10-CM | POA: Diagnosis not present

## 2015-05-19 DIAGNOSIS — M25512 Pain in left shoulder: Secondary | ICD-10-CM | POA: Diagnosis not present

## 2015-05-19 DIAGNOSIS — Z7951 Long term (current) use of inhaled steroids: Secondary | ICD-10-CM | POA: Insufficient documentation

## 2015-05-19 LAB — URINALYSIS, ROUTINE W REFLEX MICROSCOPIC
BILIRUBIN URINE: NEGATIVE
GLUCOSE, UA: NEGATIVE mg/dL
HGB URINE DIPSTICK: NEGATIVE
Ketones, ur: NEGATIVE mg/dL
NITRITE: NEGATIVE
PH: 5 (ref 5.0–8.0)
Protein, ur: NEGATIVE mg/dL
SPECIFIC GRAVITY, URINE: 1.019 (ref 1.005–1.030)
Urobilinogen, UA: 0.2 mg/dL (ref 0.0–1.0)

## 2015-05-19 LAB — CBC WITH DIFFERENTIAL/PLATELET
Basophils Absolute: 0 10*3/uL (ref 0.0–0.1)
Basophils Relative: 0 %
Eosinophils Absolute: 0 10*3/uL (ref 0.0–0.7)
Eosinophils Relative: 0 %
HEMATOCRIT: 44.7 % (ref 36.0–46.0)
Hemoglobin: 14.7 g/dL (ref 12.0–15.0)
LYMPHS PCT: 27 %
Lymphs Abs: 2.7 10*3/uL (ref 0.7–4.0)
MCH: 27.4 pg (ref 26.0–34.0)
MCHC: 32.9 g/dL (ref 30.0–36.0)
MCV: 83.2 fL (ref 78.0–100.0)
MONO ABS: 0.3 10*3/uL (ref 0.1–1.0)
MONOS PCT: 3 %
NEUTROS ABS: 6.9 10*3/uL (ref 1.7–7.7)
Neutrophils Relative %: 70 %
Platelets: 162 10*3/uL (ref 150–400)
RBC: 5.37 MIL/uL — ABNORMAL HIGH (ref 3.87–5.11)
RDW: 14.3 % (ref 11.5–15.5)
WBC: 10.1 10*3/uL (ref 4.0–10.5)

## 2015-05-19 LAB — URINE MICROSCOPIC-ADD ON

## 2015-05-19 LAB — BASIC METABOLIC PANEL
Anion gap: 11 (ref 5–15)
BUN: 19 mg/dL (ref 6–20)
CALCIUM: 9.6 mg/dL (ref 8.9–10.3)
CO2: 23 mmol/L (ref 22–32)
CREATININE: 0.8 mg/dL (ref 0.44–1.00)
Chloride: 102 mmol/L (ref 101–111)
GFR calc Af Amer: >60 mL/min (ref >60)
GFR calc non Af Amer: >60 mL/min (ref >60)
GLUCOSE: 118 mg/dL — AB (ref 65–99)
Potassium: 3.9 mmol/L (ref 3.5–5.1)
Sodium: 136 mmol/L (ref 135–145)

## 2015-05-19 MED ORDER — ACETAMINOPHEN 325 MG PO TABS
650.0000 mg | ORAL_TABLET | Freq: Once | ORAL | Status: AC
  Administered 2015-05-19: 650 mg via ORAL
  Filled 2015-05-19: qty 2

## 2015-05-19 MED ORDER — CYCLOBENZAPRINE HCL 10 MG PO TABS: 5.0000 mg | ORAL_TABLET | Freq: Two times a day (BID) | ORAL | Status: AC | PRN

## 2015-05-19 NOTE — ED Notes (Signed)
Pt sts body aches and HA x 6 days since starting a kind of generic medication per pt

## 2015-05-19 NOTE — Discharge Instructions (Signed)
1. Medications: flexeril, usual home medications 2. Treatment: rest, drink plenty of fluids 3. Follow Up: please followup with your primary doctor for discussion of your diagnoses and further evaluation after today's visit; if you do not have a primary care doctor use the resource guide provided to find one; please return to the ER for new or worsening symptoms   Musculoskeletal Pain Musculoskeletal pain is muscle and boney aches and pains. These pains can occur in any part of the body. Your caregiver may treat you without knowing the cause of the pain. They may treat you if blood or urine tests, X-rays, and other tests were normal.  CAUSES There is often not a definite cause or reason for these pains. These pains may be caused by a type of germ (virus). The discomfort may also come from overuse. Overuse includes working out too hard when your body is not fit. Boney aches also come from weather changes. Bone is sensitive to atmospheric pressure changes. HOME CARE INSTRUCTIONS   Ask when your test results will be ready. Make sure you get your test results.  Only take over-the-counter or prescription medicines for pain, discomfort, or fever as directed by your caregiver. If you were given medications for your condition, do not drive, operate machinery or power tools, or sign legal documents for 24 hours. Do not drink alcohol. Do not take sleeping pills or other medications that may interfere with treatment.  Continue all activities unless the activities cause more pain. When the pain lessens, slowly resume normal activities. Gradually increase the intensity and duration of the activities or exercise.  During periods of severe pain, bed rest may be helpful. Lay or sit in any position that is comfortable.  Putting ice on the injured area.  Put ice in a bag.  Place a towel between your skin and the bag.  Leave the ice on for 15 to 20 minutes, 3 to 4 times a day.  Follow up with your caregiver  for continued problems and no reason can be found for the pain. If the pain becomes worse or does not go away, it may be necessary to repeat tests or do additional testing. Your caregiver may need to look further for a possible cause. SEEK IMMEDIATE MEDICAL CARE IF:  You have pain that is getting worse and is not relieved by medications.  You develop chest pain that is associated with shortness or breath, sweating, feeling sick to your stomach (nauseous), or throw up (vomit).  Your pain becomes localized to the abdomen.  You develop any new symptoms that seem different or that concern you. MAKE SURE YOU:   Understand these instructions.  Will watch your condition.  Will get help right away if you are not doing well or get worse. Document Released: 08/15/2005 Document Revised: 11/07/2011 Document Reviewed: 04/19/2013 [facility] Patient Information 2015 [place], [place]. This information is not intended to replace advice given to you by your health care provider. Make sure you discuss any questions you have with your health care provider.

## 2015-05-19 NOTE — ED Provider Notes (Signed)
CSN: [id number]     Arrival date & time 05/19/15  1623 History   First MD Initiated Contact with Patient 05/19/15 2056     Chief Complaint  Patient presents with  . Generalized Body Aches    HPI   Audrey Burke is a 81 y.o. female with a PMH of HTN who presents to the ED with generalized body aches. She reports her symptoms started on Thursday and have been intermittent since that time. She is unable to identify anything that exacerbates her pain. She has tried tylenol, which has helped with her symptoms. She states she was seen by her PCP last week, and started a generic medication for her pain. She is unsure what medication she started. She denies fever, chills, headache, lightheadedness, dizziness, chest pain, shortness of breath, abdominal pain, N/V/D/C, dysuria, urgency, frequency.   Past Medical History  Diagnosis Date  . Hypertension    History reviewed. No pertinent past surgical history. History reviewed. No pertinent family history. Social History  Substance Use Topics  . Smoking status: Never Smoker   . Smokeless tobacco: None  . Alcohol Use: None   OB History    No data available      Review of Systems  Constitutional: Negative for fever, chills, activity change, appetite change and fatigue.  HENT: Negative for congestion.   Eyes: Negative for visual disturbance.  Respiratory: Negative for cough and shortness of breath.   Cardiovascular: Negative for chest pain.  Gastrointestinal: Negative for nausea, vomiting, abdominal pain, diarrhea and constipation.  Genitourinary: Negative for dysuria, urgency and frequency.  Musculoskeletal: Positive for myalgias, arthralgias and neck pain. Negative for back pain, gait problem and neck stiffness.  Neurological: Positive for headaches. Negative for dizziness, syncope, weakness, light-headedness and numbness.       Reports intermittent sinus headaches.  All other systems reviewed and are negative.     Allergies   Review of patient's allergies indicates no known allergies.  Home Medications   Prior to Admission medications   Medication Sig Start Date End Date Taking? Authorizing Provider  amLODipine (NORVASC) 5 MG tablet Take 1 tablet (5 mg total) by mouth daily. PATIENT NEEDS OFFICE VISIT FOR ADDITIONAL REFILLS - 2nd NOTICE 05/03/13   Sondra Barges, PA-C  amoxicillin (AMOXIL) 875 MG tablet Take 1 tablet (875 mg total) by mouth 2 (two) times daily. 08/08/14   Pearline Cables, MD  famotidine (PEPCID) 20 MG tablet Take 1 tablet (20 mg total) by mouth 2 (two) times daily. NEED VISIT! 04/22/13   Godfrey Pick, PA-C  fluticasone [facility]) 50 MCG/ACT nasal spray instill 1 to 2 sprays into each nostril daily if needed 08/11/14   Peyton Najjar, MD  furosemide (LASIX) 20 MG tablet Take 20 mg by mouth 2 (two) times daily.    Historical Provider, MD  gabapentin (NEURONTIN) 400 MG capsule Take 400 mg by mouth daily.    Historical Provider, MD  metoprolol succinate (TOPROL-XL) 50 MG 24 hr tablet Take 1 tablet (50 mg total) by mouth daily. PATIENT NEEDS OFFICE VISIT FOR ADDITIONAL REFILLS - 2nd NOTICE 05/03/13   Sondra Barges, PA-C  olmesartan (BENICAR) 40 MG tablet Take 1 tablet (40 mg total) by mouth daily. Needs office visit-no further refills 03/01/13   Sondra Barges, PA-C  Rivaroxaban (XARELTO) 20 MG TABS Take 20 mg by mouth daily.    Historical Provider, MD    BP 187/86 mmHg  Pulse 65  Temp(Src) 98.4 F (36.9 C) (Oral)  Resp  18  SpO2 97% Physical Exam  Constitutional: She is oriented to person, place, and time. She appears well-developed and well-nourished. No distress.  HENT:  Head: Normocephalic and atraumatic.  Right Ear: External ear normal.  Left Ear: External ear normal.  Nose: Nose normal.  Mouth/Throat: Uvula is midline, oropharynx is clear and moist and mucous membranes are normal.  Eyes: Conjunctivae, EOM and lids are normal. Pupils are equal, round, and reactive to light. Right eye exhibits no  discharge. Left eye exhibits no discharge. No scleral icterus.  Neck: Normal range of motion. Neck supple.  Cardiovascular: Normal rate, regular rhythm, normal heart sounds, intact distal pulses and normal pulses.   Pulmonary/Chest: Effort normal and breath sounds normal. No respiratory distress. She has no wheezes. She has no rales.  Abdominal: Soft. Normal appearance and bowel sounds are normal. She exhibits no distension and no mass. There is no tenderness. There is no rigidity, no rebound and no guarding.  Musculoskeletal: Normal range of motion. She exhibits tenderness. She exhibits no edema.  TTP of left thoracic paraspinal muscles. No midline tenderness, step-off, or deformity. TTP of left trapezius.   Neurological: She is alert and oriented to person, place, and time. She has normal strength. No cranial nerve deficit or sensory deficit.  Skin: Skin is warm, dry and intact. No rash noted. She is not diaphoretic. No erythema. No pallor.  Psychiatric: She has a normal mood and affect. Her speech is normal and behavior is normal. Judgment and thought content normal.  Nursing note and vitals reviewed.   ED Course  Procedures (including critical care time)  Labs Review Labs Reviewed  CBC WITH DIFFERENTIAL/PLATELET - Abnormal; Notable for the following:    RBC 5.37 (*)    All other components within normal limits  BASIC METABOLIC PANEL - Abnormal; Notable for the following:    Glucose, Bld 118 (*)    All other components within normal limits  URINALYSIS, ROUTINE W REFLEX MICROSCOPIC (NOT AT [facility]) - Abnormal; Notable for the following:    APPearance CLOUDY (*)    Leukocytes, UA MODERATE (*)    All other components within normal limits  URINE MICROSCOPIC-ADD ON - Abnormal; Notable for the following:    Squamous Epithelial / LPF MANY (*)    Bacteria, UA MANY (*)    All other components within normal limits  URINE CULTURE   Imaging Review No results found.   I have personally  reviewed and evaluated these images and lab results as part of my medical decision-making.   EKG Interpretation None      MDM   Final diagnoses:  Body aches    Patient presents with generalized body aches. Her symptoms have been present since last Thursday, and started after taking a generic medicine. Denies fever, chills, headache, lightheadedness, dizziness, chest pain, shortness of breath, abdominal pain, N/V/D/C, dysuria, urgency, frequency.  Patient is afebrile. Vital signs stable. Mild tenderness to palpation of left thoracic paraspinal muscles. No midline tenderness, step-off, or deformity. Mild tenderness to palpation of left trapezius. Heart regular rate and rhythm. Lungs clear to auscultation bilaterally. Abdomen soft, nontender, nondistended. Full range of motion of upper and lower extremities bilaterally.  CBC negative for leukocytosis. BMP unremarkable. Urinalysis with moderate leukocytes, many squamous epithelial cells, 21-50 white blood cells, many bacteria. Patient does not complain of dysuria, urgency, or frequency; doubt UTI.  Patient advised to discontinue new medication. Given muscle relaxant for symptom relief. Return precautions discussed. Patient to follow up PCP.  Patient  discussed with and seen by Dr. Criss Alvine.  BP 187/86 mmHg  Pulse 65  Temp(Src) 98.4 F (36.9 C) (Oral)  Resp 18  SpO2 97%    Mady Gemma, PA-C 05/20/15 0205  Pricilla Loveless, MD 05/22/15 [phone]

## 2015-05-19 NOTE — ED Notes (Signed)
Pt notified of need for urine. ?

## 2015-05-21 LAB — URINE CULTURE

## 2015-05-23 ENCOUNTER — Ambulatory Visit (INDEPENDENT_AMBULATORY_CARE_PROVIDER_SITE_OTHER): Payer: Medicare Other | Admitting: Family Medicine

## 2015-05-23 VITALS — BP 144/94 | HR 87 | Temp 98.0°F | Resp 17 | Ht 61.5 in | Wt 188.0 lb

## 2015-05-23 DIAGNOSIS — G8929 Other chronic pain: Secondary | ICD-10-CM

## 2015-05-23 DIAGNOSIS — M6248 Contracture of muscle, other site: Secondary | ICD-10-CM | POA: Diagnosis not present

## 2015-05-23 DIAGNOSIS — M62838 Other muscle spasm: Secondary | ICD-10-CM

## 2015-05-23 MED ORDER — DICLOFENAC SODIUM 1 % TD GEL: 2.0000 g | Freq: Four times a day (QID) | TRANSDERMAL | Status: DC

## 2015-05-23 NOTE — Patient Instructions (Addendum)
Your blood pressure is to high.  You were on several blood pressure medications prior so please follow-up with your primary doctor to ensure you are taking the correct blood pressure medications.  Your blood pressure might improve when you are no longer in pain so you should recheck it when you are feeling better.  You were prescribed flexeril (cyclobenzaprine) from the ER 4 days ago.  To take 1/2 tab twice a day - you should do this.  Apply HEAT to your back and neck.  IF the cyclobenzaprine is making you to sleepy, we can try a different medicine so you can call and leave a message is it is making you to sleepy.  You should take tylenol twice a day.  You can try rubbing on VOLTAREN GEL.  If you insurance won't cover it or it is to expensive, TRY ASPERCREME INSTEAD.  Continue to apply a heating pad to your back and neck at least 4 times a day for a 2 weeks and you need to do very gentle neck and back stretches.  If this is not helping, we will send you to physical therapy to help with this.    Back Exercises Back exercises help treat and prevent back injuries. The goal is to increase your strength in your belly (abdominal) and back muscles. These exercises can also help with flexibility. Start these exercises when told by your doctor. HOME CARE Back exercises include: Pelvic Tilt.  Lie on your back with your knees bent. Tilt your pelvis until the lower part of your back is against the floor. Hold this position 5 to 10 sec. Repeat this exercise 5 to 10 times. Knee to Chest.  Pull 1 knee up against your chest and hold for 20 to 30 seconds. Repeat this with the other knee. This may be done with the other leg straight or bent, whichever feels better. Then, pull both knees up against your chest. Sit-Ups or Curl-Ups.  Bend your knees 90 degrees. Start with tilting your pelvis, and do a partial, slow sit-up. Only lift your upper half 30 to 45 degrees off the floor. Take at least 2 to 3 seonds for  each sit-up. Do not do sit-ups with your knees out straight. If partial sit-ups are difficult, simply do the above but with only tightening your belly (abdominal) muscles and holding it as told. Hip-Lift.  Lie on your back with your knees flexed 90 degrees. Push down with your feet and shoulders as you raise your hips 2 inches off the floor. Hold for 10 seconds, repeat 5 to 10 times. Back Arches.  Lie on your stomach. Prop yourself up on bent elbows. Slowly press on your hands, causing an arch in your low back. Repeat 3 to 5 times. Shoulder-Lifts.  Lie face down with arms beside your body. Keep hips and belly pressed to floor as you slowly lift your head and shoulders off the floor. Do not overdo your exercises. Be careful in the beginning. Exercises may cause you some mild back discomfort. If the pain lasts for more than 15 minutes, stop the exercises until you see your doctor. Improvement with exercise for back problems is slow.  Document Released: 09/17/2010 Document Revised: 11/07/2011 Document Reviewed: 06/16/2011 [facility] Patient Information 2015 [place], [place]. This information is not intended to replace advice given to you by your health care provider. Make sure you discuss any questions you have with your health care provider.

## 2015-05-23 NOTE — Progress Notes (Signed)
Subjective:  This chart was scribed for Norberto Sorenson, MD by [facility], medical scribe at [facility].The patient was seen in exam room 10 and the patient's care was started at 11:05 AM.   Patient ID: Audrey Burke, female    DOB: June 17, 1932, 81 y.o.   MRN: [id number] Chief Complaint  Patient presents with  . Shoulder Pain  . Headache   HPI  HPI Comments: Audrey Burke is a 81 y.o. female who presents to [facility] complaining of left lumbar pain that radiates through her upper thoracic, and cervical spine to her head. This began 8 days ago. This is a recurrent, intermittent issue and takes Advil for relief, also she does use a heating pad. Today is worse than usual. Medication was changed which did not work very well. States she works very hard but unsure the cause of the pain. Able to sleep in a bed using 1 pillow. Has not had a massage because it is too painful.  Bilateral upper thoracic back spasms. Started on an new unknown medication which triggered the pain. Symptoms improved while in the ER after she was given tylenol. Stated she was seen by her PCP the previous week and he started her on a pain medicine which triggered the pain. CBC and BMP were normal. Urine culture was contaminated. Started on cyclobenzaprine 5 mg BID and advised to follow up with her PCP. Given a 20 day supply of the flexeril four days ago  Past Medical History  Diagnosis Date  . Hypertension    Current Outpatient Prescriptions on File Prior to Visit  Medication Sig Dispense Refill  . amLODipine (NORVASC) 5 MG tablet Take 1 tablet (5 mg total) by mouth daily. PATIENT NEEDS OFFICE VISIT FOR ADDITIONAL REFILLS - 2nd NOTICE (Patient not taking: Reported on 05/23/2015) 15 tablet 0  . cyclobenzaprine (FLEXERIL) 10 MG tablet Take 0.5 tablets (5 mg total) by mouth 2 (two) times daily as needed for muscle spasms. (Patient not taking: Reported on 05/23/2015) 20 tablet 0  .  famotidine (PEPCID) 20 MG tablet Take 1 tablet (20 mg total) by mouth 2 (two) times daily. NEED VISIT! (Patient not taking: Reported on 05/23/2015) 60 tablet 0  . fluticasone (FLONASE) 50 MCG/ACT nasal spray instill 1 to 2 sprays into each nostril daily if needed (Patient not taking: Reported on 05/23/2015) 16 g 5  . furosemide (LASIX) 20 MG tablet Take 20 mg by mouth 2 (two) times daily.    Marland Kitchen gabapentin (NEURONTIN) 400 MG capsule Take 400 mg by mouth daily.    . metoprolol succinate (TOPROL-XL) 50 MG 24 hr tablet Take 1 tablet (50 mg total) by mouth daily. PATIENT NEEDS OFFICE VISIT FOR ADDITIONAL REFILLS - 2nd NOTICE (Patient not taking: Reported on 05/23/2015) 15 tablet 0  . olmesartan (BENICAR) 40 MG tablet Take 1 tablet (40 mg total) by mouth daily. Needs office visit-no further refills (Patient not taking: Reported on 05/23/2015) 5 tablet 0  . Rivaroxaban (XARELTO) 20 MG TABS Take 20 mg by mouth daily.     No current facility-administered medications on file prior to visit.   No Known Allergies  Depression screen PHQ 2/9 05/23/2015  Decreased Interest 0  Down, Depressed, Hopeless 0  PHQ - 2 Score 0     Review of Systems  Constitutional: Positive for activity change and fatigue.  Eyes: Negative for visual disturbance.  Respiratory: Negative for chest tightness and shortness of breath.   Cardiovascular: Negative  for chest pain, palpitations and leg swelling.  Musculoskeletal: Positive for myalgias, back pain, arthralgias, neck pain and neck stiffness. Negative for joint swelling and gait problem.  Neurological: Positive for weakness and headaches. Negative for numbness.  Hematological: Negative for adenopathy.  Psychiatric/Behavioral: Negative for sleep disturbance and dysphoric mood.      Objective:  BP 144/94 mmHg  Pulse 87  Temp(Src) 98 F (36.7 C) (Oral)  Resp 17  Ht 5' 1.5" (1.562 m)  Wt 188 lb (85.276 kg)  BMI 34.95 kg/m2  SpO2 97% Physical Exam  Constitutional: She is  oriented to person, place, and time. She appears well-developed and well-nourished. No distress.  HENT:  Head: Normocephalic and atraumatic.  Eyes: Pupils are equal, round, and reactive to light.  Neck: Normal range of motion.  Cardiovascular: Normal rate and regular rhythm.   Pulmonary/Chest: Effort normal. No respiratory distress.  Musculoskeletal: Normal range of motion.  Palpable muscle spasm in the rhomboid and trapezius  Neurological: She is alert and oriented to person, place, and time.  Reflex Scores:      Bicep reflexes are 2+ on the right side and 2+ on the left side.      Brachioradialis reflexes are 2+ on the right side and 2+ on the left side. Skin: Skin is warm and dry.  Psychiatric: She has a normal mood and affect. Her behavior is normal.  Nursing note and vitals reviewed.     Assessment & Plan:   1. Trapezius muscle spasm - given flexeril in ER 4d ago which she hasn't tried - advised trial of 1/2 tab bid with heat qid and gentle stretching. If flexeril causes to much fatigue call and can switch to robaxin.  Cont tylneol bid. Try either aspercreme or diclofenac topically.  2. Chronic pain   3.  HTN - f/u w/ PCP  Orders Placed This Encounter  Procedures  . Ambulatory referral to Physical Therapy    Referral Priority:  Routine    Referral Type:  Physical Medicine    Referral Reason:  Specialty Services Required    Requested Specialty:  Physical Therapy    Number of Visits Requested:  1    Meds ordered this encounter  Medications  . DISCONTD: acetaminophen (TYLENOL) 325 MG tablet    Sig: Take 650 mg by mouth every 6 (six) hours as needed.  . diclofenac sodium (VOLTAREN) 1 % GEL    Sig: Apply 2 g topically 4 (four) times daily.    Dispense:  100 g    Refill:  2    I personally performed the services described in this documentation, which was scribed in my presence. The recorded information has been reviewed and considered, and addended by me as needed.  Norberto Sorenson, MD MPH   By signing my name below, I, Nadim Abuhashem, attest that this documentation has been prepared under the direction and in the presence of Norberto Sorenson, MD.  Electronically Signed: Conchita Paris, medical scribe. 05/23/2015, 11:10 AM.

## 2015-05-26 ENCOUNTER — Encounter (HOSPITAL_COMMUNITY): Payer: Self-pay

## 2015-05-26 ENCOUNTER — Emergency Department (HOSPITAL_COMMUNITY)
Admission: EM | Admit: 2015-05-26 | Discharge: 2015-05-27 | Disposition: A | Discharge status: Home / Self Care | Payer: Medicare Other | Attending: Emergency Medicine | Admitting: Emergency Medicine

## 2015-05-26 DIAGNOSIS — F329 Major depressive disorder, single episode, unspecified: Secondary | ICD-10-CM | POA: Diagnosis present

## 2015-05-26 DIAGNOSIS — I10 Essential (primary) hypertension: Secondary | ICD-10-CM | POA: Insufficient documentation

## 2015-05-26 DIAGNOSIS — M545 Low back pain: Secondary | ICD-10-CM | POA: Insufficient documentation

## 2015-05-26 DIAGNOSIS — G8929 Other chronic pain: Secondary | ICD-10-CM | POA: Insufficient documentation

## 2015-05-26 DIAGNOSIS — M791 Myalgia, unspecified site: Secondary | ICD-10-CM

## 2015-05-26 DIAGNOSIS — Z791 Long term (current) use of non-steroidal anti-inflammatories (NSAID): Secondary | ICD-10-CM | POA: Insufficient documentation

## 2015-05-26 DIAGNOSIS — Z79899 Other long term (current) drug therapy: Secondary | ICD-10-CM | POA: Insufficient documentation

## 2015-05-26 DIAGNOSIS — Z7901 Long term (current) use of anticoagulants: Secondary | ICD-10-CM | POA: Diagnosis not present

## 2015-05-26 LAB — COMPREHENSIVE METABOLIC PANEL
ALBUMIN: 4.3 g/dL (ref 3.5–5.0)
ALT: 14 U/L (ref 14–54)
AST: 18 U/L (ref 15–41)
Alkaline Phosphatase: 63 U/L (ref 38–126)
Anion gap: 6 (ref 5–15)
BILIRUBIN TOTAL: 0.9 mg/dL (ref 0.3–1.2)
BUN: 16 mg/dL (ref 6–20)
CO2: 23 mmol/L (ref 22–32)
CREATININE: 0.74 mg/dL (ref 0.44–1.00)
Calcium: 9.1 mg/dL (ref 8.9–10.3)
Chloride: 100 mmol/L — ABNORMAL LOW (ref 101–111)
GFR calc Af Amer: >60 mL/min (ref >60)
GFR calc non Af Amer: >60 mL/min (ref >60)
GLUCOSE: 126 mg/dL — AB (ref 65–99)
POTASSIUM: 4 mmol/L (ref 3.5–5.1)
Sodium: 129 mmol/L — ABNORMAL LOW (ref 135–145)
TOTAL PROTEIN: 7.3 g/dL (ref 6.5–8.1)

## 2015-05-26 LAB — CBC WITH DIFFERENTIAL/PLATELET
BASOS ABS: 0 10*3/uL (ref 0.0–0.1)
BASOS PCT: 0 %
Eosinophils Absolute: 0 10*3/uL (ref 0.0–0.7)
Eosinophils Relative: 0 %
HEMATOCRIT: 42.3 % (ref 36.0–46.0)
HEMOGLOBIN: 14.5 g/dL (ref 12.0–15.0)
LYMPHS PCT: 19 %
Lymphs Abs: 2 10*3/uL (ref 0.7–4.0)
MCH: 28.1 pg (ref 26.0–34.0)
MCHC: 34.3 g/dL (ref 30.0–36.0)
MCV: 82 fL (ref 78.0–100.0)
MONO ABS: 0.6 10*3/uL (ref 0.1–1.0)
Monocytes Relative: 6 %
NEUTROS ABS: 7.7 10*3/uL (ref 1.7–7.7)
NEUTROS PCT: 75 %
Platelets: 160 10*3/uL (ref 150–400)
RBC: 5.16 MIL/uL — AB (ref 3.87–5.11)
RDW: 14 % (ref 11.5–15.5)
WBC: 10.3 10*3/uL (ref 4.0–10.5)

## 2015-05-26 LAB — URINALYSIS, ROUTINE W REFLEX MICROSCOPIC
Bilirubin Urine: NEGATIVE
GLUCOSE, UA: NEGATIVE mg/dL
Ketones, ur: NEGATIVE mg/dL
Nitrite: NEGATIVE
PH: 5.5 (ref 5.0–8.0)
Protein, ur: NEGATIVE mg/dL
Specific Gravity, Urine: 1.015 (ref 1.005–1.030)
Urobilinogen, UA: 0.2 mg/dL (ref 0.0–1.0)

## 2015-05-26 LAB — URINE MICROSCOPIC-ADD ON

## 2015-05-26 MED ORDER — KETOROLAC TROMETHAMINE 60 MG/2ML IM SOLN
60.0000 mg | Freq: Once | INTRAMUSCULAR | Status: AC
  Administered 2015-05-26: 60 mg via INTRAMUSCULAR
  Filled 2015-05-26: qty 2

## 2015-05-26 NOTE — ED Notes (Signed)
Writer contacted patient's son. Patient's son states that the patient has not taken her neurontin for several weeks and she has been diagnosed with psychosomatic pain and since she has not taken her meds she is gong to get worse. Patient's son stated that if we needed to know anything else then we should call her psychiatrist and hold on for the after hours number and he would pick up(Paul Buongirno [phone])

## 2015-05-26 NOTE — ED Provider Notes (Signed)
CSN: [id number]     Arrival date & time 05/26/15  1813 History   First MD Initiated Contact with Patient 05/26/15 1958     Chief Complaint  Patient presents with  . not taking medications    . Medical Clearance  . Depression   HPI  Audrey Burke is an 81 year old female with PMHx of HTN and chronic pain presenting with acute worsening of chronic back and shoulder pain. Pt reports her pain is located in her midthoracic spine that radiates up into her left shoulder. Pt states that she was previously on a brand name medication for her pain control which gave her good symptom relief. Pt states that 2-3 weeks ago, her prescription was changed to generic and it no longer controls her pain. Pt's son reported that she is not taking any of her medications but she denies this. She states she continues her home regimen except for the "new generic one because it makes me feel terrible". Pt's son sent pt to behavioral health earlier today requesting a psych evaluation for her depression. Pt states that she does have chronic depression and she gets more depressed when she is in pain. She is tearful when discussing her pain issues and she states "when the pain is so bad I want to hurt myself". She denies current suicidal or self harming thoughts. Denies HI or AVH. Pt denies any other complaints at this time.   Past Medical History  Diagnosis Date  . Hypertension    History reviewed. No pertinent past surgical history. Family History  Problem Relation Age of Onset  . Family history unknown: Yes   Social History  Substance Use Topics  . Smoking status: Never Smoker   . Smokeless tobacco: Never Used  . Alcohol Use: No   OB History    No data available     Review of Systems  Musculoskeletal: Positive for myalgias, back pain and arthralgias.  Psychiatric/Behavioral: Positive for dysphoric mood. Negative for suicidal ideas, hallucinations and self-injury.  All other systems reviewed and are  negative.     Allergies  Review of patient's allergies indicates no known allergies.  Home Medications   Prior to Admission medications   Medication Sig Start Date End Date Taking? Authorizing Provider  amLODipine (NORVASC) 5 MG tablet Take 1 tablet (5 mg total) by mouth daily. PATIENT NEEDS OFFICE VISIT FOR ADDITIONAL REFILLS - 2nd NOTICE 05/03/13  Yes Ryan M Dunn, PA-C  cyclobenzaprine (FLEXERIL) 10 MG tablet Take 0.5 tablets (5 mg total) by mouth 2 (two) times daily as needed for muscle spasms. 05/19/15  Yes Mady Gemma, PA-C  diclofenac sodium (VOLTAREN) 1 % GEL Apply 2 g topically 4 (four) times daily. 05/23/15  Yes Sherren Mocha, MD  gabapentin (NEURONTIN) 800 MG tablet Take 800 mg by mouth daily. 05/08/15  Yes Historical Provider, MD  metoprolol succinate (TOPROL-XL) 50 MG 24 hr tablet Take 1 tablet (50 mg total) by mouth daily. PATIENT NEEDS OFFICE VISIT FOR ADDITIONAL REFILLS - 2nd NOTICE 05/03/13  Yes Ryan M Dunn, PA-C  mirtazapine (REMERON) 30 MG tablet Take 30 mg by mouth at bedtime. 05/08/15  Yes Historical Provider, MD  olmesartan (BENICAR) 40 MG tablet Take 1 tablet (40 mg total) by mouth daily. Needs office visit-no further refills 03/01/13  Yes Ryan M Dunn, PA-C  Rivaroxaban (XARELTO) 20 MG TABS Take 20 mg by mouth daily.   Yes Historical Provider, MD  furosemide (LASIX) 20 MG tablet Take 20 mg by mouth  2 (two) times daily.    Historical Provider, MD  oxyCODONE-acetaminophen (PERCOCET/ROXICET) 5-325 MG tablet Take 1 tablet by mouth every 4 (four) hours as needed for severe pain. 05/27/15   Shari Upstill, PA-C   BP 186/87 mmHg  Pulse 72  Temp(Src) 98.3 F (36.8 C) (Oral)  Resp 20  Ht  (1.651 m)  Wt 200 lb (90.719 kg)  BMI 33.28 kg/m2  SpO2 98% Physical Exam  Constitutional: She appears well-developed and well-nourished.  HENT:  Head: Normocephalic and atraumatic.  Eyes: Conjunctivae are normal. Right eye exhibits no discharge. Left eye exhibits no discharge. No  scleral icterus.  Neck: Normal range of motion.  Cardiovascular: Normal rate and regular rhythm.   Pulmonary/Chest: Effort normal and breath sounds normal. No respiratory distress. She has no wheezes. She has no rales.  Abdominal: Soft. There is no tenderness.  Musculoskeletal: Normal range of motion.  Pt moves upper extremities spontaneously and without pain. Pt pushes herself up on the stretcher with ease. TTP over thoracic paraspinous muscles. No TTP over shoulders b/l  Neurological: She is alert. Coordination normal.  5/5 strength BUE. Sensation to light touch intact.   Skin: Skin is warm and dry. No rash noted.  Psychiatric: She has a normal mood and affect. Her speech is normal and behavior is normal. She expresses no homicidal and no suicidal ideation. She expresses no suicidal plans and no homicidal plans.  Pt reports that she has chronic depression and denies any recent changes in her mood. Denies SI/HI or AVH. Pt is tearful when discussing her pain issues and states the uncontrolled pain makes her depression worse  Nursing note and vitals reviewed.   ED Course  Procedures (including critical care time) Labs Review Labs Reviewed  CBC WITH DIFFERENTIAL/PLATELET - Abnormal; Notable for the following:    RBC 5.16 (*)    All other components within normal limits  COMPREHENSIVE METABOLIC PANEL - Abnormal; Notable for the following:    Sodium 129 (*)    Chloride 100 (*)    Glucose, Bld 126 (*)    All other components within normal limits  URINALYSIS, ROUTINE W REFLEX MICROSCOPIC (NOT AT [facility]) - Abnormal; Notable for the following:    APPearance CLOUDY (*)    Hgb urine dipstick TRACE (*)    Leukocytes, UA MODERATE (*)    All other components within normal limits  URINE MICROSCOPIC-ADD ON - Abnormal; Notable for the following:    Squamous Epithelial / LPF MANY (*)    Bacteria, UA MANY (*)    All other components within normal limits  URINE CULTURE    Imaging Review No results  found. I have personally reviewed and evaluated these images and lab results as part of my medical decision-making.   EKG Interpretation None      MDM   Final diagnoses:  Myalgia  Chronic pain   Pt presenting at the request of her son for psych evaluation for depression. Her son states that she has not been taking her medicines for the past 3 weeks and her depression has worsened. Pt denies current SI; she states that when her pain is uncontrolled her depression worsens. Pt admits to stopping her gabapentin because she was recently changed from brand to generic and states that it made her pain worse. She is continuing her other medicines. Pt with thoracic paraspinous muscle tenderness. FROM intact. Pt tearful when discussing worsening of her chronic pain. Pain controlled in ED with toradol. Will consult TTS for psych  clearance before discharging home. Discussed with pt that she needs to follow up with her PCP (Dr. Donette Larry) to be switched back to brand name neurontin. Will discharge with short course of percocet for pain control. Pt signed out to Elpidio Anis, PA-C at shift change pending TTS clearance.     Audrey Heimlich, PA-C 05/27/15 1134  Bethann Berkshire, MD 05/27/15 [phone]

## 2015-05-26 NOTE — ED Notes (Addendum)
Patient was brought in by [facility] transportation. Patient's son is out of town and called the transportation company to bring the patient to the ED for medical clearance stating she has not taken her medication x 3 weeks. Patient was oriented to self, date, and time. Transporter called her supervisor and was relayed that the patient has been feeling depressed and the patient confirms that. The patient states her son lives in [place]. Patient's son/[name] Brendle states he can be reached at [phone]. Patient states she has been feeling depressed ,but denies SI/HI. Patient denies any auditory or visual hallucinations.

## 2015-05-26 NOTE — BHH Counselor (Signed)
Pt presented BIB employee from [facility], a home care medical staffing agency. Employee reports she was told by pt's son [name] to "drop off patient at the psych ward" and that pt's psychiatrist in [place] could provide info.Writer spoke w/ son [name] [phone], cell [phone]. While Clinical research associate asked questions to clarify why pt was brought to [facility], son replied a few times that Clinical research associate should speak w/ pt's psychiatrist in [place], [name] [phone]. Writer explained to [name] that pt couldn't be admitted to [facility] without a formal assessment. [name] reports that pt has "somatic issues" including chronic pain. He says that pt "is depressed." Son reports that pt quit taking her Gabapentin 8 days ago and she would likely be having side effects. Then son hurriedly got off the phone. Pt reports she needs medication to reduce her chronic pain. She denies SI and HI. Writer encouraged [name] employee to drive pt to [facility] so pt could be medically evaluated, and psychiatrically evaluated, if deemed necessary by EDP.  Evette Cristal, [place] Therapeutic Triage Specialist

## 2015-05-27 MED ORDER — OXYCODONE-ACETAMINOPHEN 5-325 MG PO TABS: 1.0000 | ORAL_TABLET | ORAL | Status: DC | PRN

## 2015-05-27 NOTE — ED Provider Notes (Signed)
Pending TTS consultation for depression to determine stability/safety for discharge home. If cleared by TTS, the patient will go home with pain medications, per Dr. Estell Harpin, and will f/u with PCP tomorrow.   Patient care signed out at end of shift from [facility], PA-C.   Discussed with Harriett Sine with TTS who feels the patient is stable for discharge home. She is not suicidal. She is alert, oriented and provides a reliable history. She feels her pain is due to taking care of acutely ill husband and increased stress and physical load. UA shows ?UTI but review of chart shows similarly questionable UA on 05/19/15 with a negative urine culture on review. Will recommend follow up with PCP on urine culture from today.   She will be discharged home according to plan of Dr. Estell Harpin and PA Barrett.   Elpidio Anis, PA-C 05/27/15 0112  Loren Racer, MD 05/27/15 [phone]

## 2015-05-27 NOTE — BH Assessment (Addendum)
Reviewed ED notes prior to initiating assessment. Per notes Pt was brought to [facility] earlier in the evening by a transport company at request of son. Pt was then sent to [facility] for clearance and assessment. Pt has hx of pain and reports it has been getting worse after change to generic. She reports it did not work and made her feel ill. Her son told ED staff pt was not taking any of her medication and has been dx with psychosomatic pain. Pt denies this non compliance with other medications. She does endorse chronic depression but notes it is no worse than usual and she denies SI and HI.   Requested cart to be placed with pt for assessment. Lana RN request time to place cart as she is placing an IV with another pt.   Will initiate assessment at 0030.  First attempt at 0030, no answer.   Second attempt 0038, no answer.   Informed ED staff, Eunice Blase reports she does not think cart has been placed she will place cart.   Audrey Burke, [facility] Triage Specialist 05/27/2015 12:17 AM

## 2015-05-27 NOTE — BH Assessment (Signed)
Tele Assessment Note   Audrey Burke is an 81 y.o. female. Brought to [facility] by transport company at instruction of son. Pt was seen briefly at [facility] and then sent to [facility]. Per TTS note: Pt presented BIB employee from [facility], a home care medical staffing agency. Employee reports she was told by pt's son Audrey Burke to "drop off patient at the psych ward" and that pt's psychiatrist in [place] could provide info.Writer spoke w/ son Audrey Burke [phone], cell [phone]. While Clinical research associate asked questions to clarify why pt was brought to [facility], son replied a few times that Clinical research associate should speak w/ pt's psychiatrist in [place], Audrey Burke [phone]. Writer explained to Audrey Burke that pt couldn't be admitted to [facility] without a formal assessment. Audrey Burke reports that pt has "somatic issues" including chronic pain. He says that pt "is depressed." Son reports that pt quit taking her Gabapentin 8 days ago and she would likely be having side effects. Then son hurriedly got off the phone. Pt reports she needs medication to reduce her chronic pain. She denies SI and HI. Writer encouraged [name] employee to drive pt to [facility] so pt could be medically evaluated, and psychiatrically evaluated, if deemed necessary by EDP.  At time of assessment pt was alert and oriented times 4 with pleasant mood and appropriate affect. Pt moved to from [place] 48 years ago, and reports some language barriers at time. Speech was logical and coherent. She denies SI, HI, AVH, SA and self harm. She reports she takes her medications as prescribed, but noted 3 weeks ago she was given a different version of her gabapentin and since then the pain has been different and worse. She reports in the past she may have some pain in her back or hip, but now it is in one shoulder and then other. She reports at times the pain brings her to tears. Of note her husband recently had surgery and no longer goes outside. Pt reports she does all  the housekeeping both inside and out, and takes care of all the shopping. Included in the outside work is gardening. Pt reports she has been more physically active. She reports she enjoys being outside and working in the garden. She is also active in her church, and recently attended the [place].   Pt reports she has a hx of depression but is has been well managed. She reports tearfulness related to pain not depression. She reports he doctor put her on medication three years ago for depression. She reports her appetite has gone up and down from week to week but has improved this week. She denies sx of mania or hypomania. Pt denies hx of abuse or neglect. Denies sx of anxiety and does not use substances.   Family hx is negative for SA, MH, or SI concerns. Pt has no concerns other than wanting to have her pain better managed, and wanting to determine the recent changes in her pain levels. She is agreeable to following up with her OP psychiatrist and PCP regarding present needs.   Axis I: 296.21 Major Depressive Disorder, mild  Past Medical History:  Past Medical History  Diagnosis Date  . Hypertension     History reviewed. No pertinent past surgical history.  Family History:  Family History  Problem Relation Age of Onset  . Family history unknown: Yes    Social History:  reports that she has never smoked. She has never used smokeless tobacco. She reports that  she does not drink alcohol or use illicit drugs.  Additional Social History:  Alcohol / Drug Use Pain Medications: See PTA Prescriptions: See PTA, reports compliance, except with new generic form  Over the Counter: See PTA History of alcohol / drug use?: No history of alcohol / drug abuse Longest period of sobriety (when/how long): NA Negative Consequences of Use:  (NA)  CIWA: CIWA-Ar BP: 186/87 mmHg Pulse Rate: 72 COWS:    PATIENT STRENGTHS: (choose at least two) Ability for insight Average or above average  intelligence Communication skills General fund of knowledge Special hobby/interest Supportive family/friends  Allergies: No Known Allergies  Home Medications:  (Not in a hospital admission)  OB/GYN Status:  No LMP recorded. Patient is postmenopausal.  General Assessment Data Location of Assessment: [facility] ED TTS Assessment: In system Is this a Tele or Face-to-Face Assessment?: Face-to-Face Is this an Initial Assessment or a Re-assessment for this encounter?: Initial Assessment Marital status: Married Is patient pregnant?: No Pregnancy Status: No Living Arrangements: Spouse/significant other Can pt return to current living arrangement?: Yes Admission Status: Voluntary Is patient capable of signing voluntary admission?: Yes Referral Source: Self/Family/Friend Insurance type: [facility]     Crisis Care Plan Living Arrangements: Spouse/significant other Name of Psychiatrist: Dr. Thomes Cake of [place]  Name of Therapist: none  Education Status Is patient currently in school?: No Current Grade: NA Highest grade of school patient has completed: NA Name of school: NA Contact person: NA  Risk to self with the past 6 months Suicidal Ideation: No Has patient been a risk to self within the past 6 months prior to admission? : No Suicidal Intent: No Has patient had any suicidal intent within the past 6 months prior to admission? : No Is patient at risk for suicide?: No Suicidal Plan?: No Has patient had any suicidal plan within the past 6 months prior to admission? : No Access to Means: No What has been your use of drugs/alcohol within the last 12 months?: none Previous Attempts/Gestures: No How many times?: 0 Other Self Harm Risks: none Triggers for Past Attempts: None known Intentional Self Injurious Behavior: None Family Suicide History: No Recent stressful life event(s):  (increase in pain, SO had surgery ) Persecutory voices/beliefs?: No Depression: Yes Depression Symptoms:  Tearfulness (reports hx of depression and crying recently due to pain ) Substance abuse history and/or treatment for substance abuse?: No Suicide prevention information given to non-admitted patients: Not applicable  Risk to Others within the past 6 months Homicidal Ideation: No Does patient have any lifetime risk of violence toward others beyond the six months prior to admission? : No Thoughts of Harm to Others: No Current Homicidal Intent: No Current Homicidal Plan: No Access to Homicidal Means: No Identified Victim: none History of harm to others?: No Assessment of Violence: None Noted Violent Behavior Description: no Does patient have access to weapons?: No Criminal Charges Pending?: No Does patient have a court date: No Is patient on probation?: No  Psychosis Hallucinations: None noted Delusions: None noted  Mental Status Report Appearance/Hygiene: Unremarkable Eye Contact: Good Motor Activity: Other (Comment) (holding head) Speech: Logical/coherent Level of Consciousness: Alert Mood: Euthymic Affect: Appropriate to circumstance Anxiety Level: None Thought Processes: Coherent, Relevant Judgement: Unimpaired Orientation: Person, Place, Time, Situation Obsessive Compulsive Thoughts/Behaviors: None  Cognitive Functioning Concentration: Normal Memory: Recent Intact, Remote Intact IQ: Average Insight: Good Impulse Control: Good Appetite:  (varies one week to the next) Weight Loss: 0 Weight Gain: 0 Sleep: Decreased Total Hours of Sleep: 6  Vegetative Symptoms: None  ADLScreening [facility] Assessment Services) Patient's cognitive ability adequate to safely complete daily activities?: Yes Patient able to express need for assistance with ADLs?: Yes Independently performs ADLs?: Yes (appropriate for developmental age)  Prior Inpatient Therapy Prior Inpatient Therapy: No Prior Therapy Dates: NA Prior Therapy Facilty/Provider(s): NA Reason for Treatment: NA  Prior  Outpatient Therapy Prior Outpatient Therapy: Yes Prior Therapy Dates: ongoing Prior Therapy Facilty/Provider(s): Dr. Renae Fickle B Reason for Treatment: psychiatry  Does patient have an ACCT team?: No Does patient have Intensive In-House Services?  : No Does patient have [facility] services? : No Does patient have P4CC services?: No  ADL Screening (condition at time of admission) Patient's cognitive ability adequate to safely complete daily activities?: Yes Is the patient deaf or have difficulty hearing?: No Does the patient have difficulty seeing, even when wearing glasses/contacts?: No Does the patient have difficulty concentrating, remembering, or making decisions?: No Patient able to express need for assistance with ADLs?: Yes Does the patient have difficulty dressing or bathing?: No Independently performs ADLs?: Yes (appropriate for developmental age) Does the patient have difficulty walking or climbing stairs?: No Weakness of Legs: None Weakness of Arms/Hands: None  Home Assistive Devices/Equipment Home Assistive Devices/Equipment: None    Abuse/Neglect Assessment (Assessment to be complete while patient is alone) Physical Abuse: Denies Verbal Abuse: Denies Sexual Abuse: Denies Exploitation of patient/patient's resources: Denies Self-Neglect: Denies Values / Beliefs Cultural Requests During Hospitalization: None Spiritual Requests During Hospitalization: None   Advance Directives (For Healthcare) Does patient have an advance directive?: No Would patient like information on creating an advanced directive?: No - patient declined information    Additional Information 1:1 In Past 12 Months?: No CIRT Risk: No Elopement Risk: No Does patient have medical clearance?: Yes     Disposition:  Per Donell Sievert, PA does not meet criteria and can be discharged back to OP providers Elpidio Anis PA-C in agreement with this plan.  Clista Bernhardt, [facility] Triage Specialist 05/27/2015  1:32 AM     STEPHENSON,NANCY M 05/27/2015 1:24 AM

## 2015-05-27 NOTE — Discharge Instructions (Signed)
FOLLOW UP WITH YOUR DOCTOR FOR RECHECK IN THE NEXT 1-2 DAYS AS DISCUSSED. TAKE PERCOCET FOR PAIN AS NEEDED BUT NO MORE THAN EVERY 4 HOURS. RETURN HERE AS NEEDED.

## 2015-05-28 LAB — URINE CULTURE: SPECIAL REQUESTS: NORMAL

## 2015-06-25 DIAGNOSIS — D510 Vitamin B12 deficiency anemia due to intrinsic factor deficiency: Secondary | ICD-10-CM | POA: Diagnosis not present

## 2015-06-25 DIAGNOSIS — Z6833 Body mass index (BMI) 33.0-33.9, adult: Secondary | ICD-10-CM | POA: Diagnosis not present

## 2015-06-25 DIAGNOSIS — J0100 Acute maxillary sinusitis, unspecified: Secondary | ICD-10-CM | POA: Diagnosis not present

## 2015-06-25 DIAGNOSIS — I10 Essential (primary) hypertension: Secondary | ICD-10-CM | POA: Diagnosis not present

## 2016-09-18 ENCOUNTER — Emergency Department (HOSPITAL_COMMUNITY): Payer: Medicare Other

## 2016-09-18 ENCOUNTER — Inpatient Hospital Stay (HOSPITAL_COMMUNITY)
Admission: EM | Admit: 2016-09-18 | Discharge: 2016-09-22 | DRG: 481 | Disposition: A | Discharge status: Skilled Nursing Facility | Payer: Medicare Other | Attending: Internal Medicine | Admitting: Internal Medicine

## 2016-09-18 ENCOUNTER — Encounter (HOSPITAL_COMMUNITY): Payer: Self-pay | Admitting: *Deleted

## 2016-09-18 DIAGNOSIS — Y93E2 Activity, laundry: Secondary | ICD-10-CM

## 2016-09-18 DIAGNOSIS — Z781 Physical restraint status: Secondary | ICD-10-CM | POA: Diagnosis not present

## 2016-09-18 DIAGNOSIS — Z79899 Other long term (current) drug therapy: Secondary | ICD-10-CM

## 2016-09-18 DIAGNOSIS — I4581 Long QT syndrome: Secondary | ICD-10-CM | POA: Diagnosis present

## 2016-09-18 DIAGNOSIS — R9431 Abnormal electrocardiogram [ECG] [EKG]: Secondary | ICD-10-CM | POA: Diagnosis not present

## 2016-09-18 DIAGNOSIS — I110 Hypertensive heart disease with heart failure: Secondary | ICD-10-CM | POA: Diagnosis present

## 2016-09-18 DIAGNOSIS — F05 Delirium due to known physiological condition: Secondary | ICD-10-CM | POA: Diagnosis not present

## 2016-09-18 DIAGNOSIS — E039 Hypothyroidism, unspecified: Secondary | ICD-10-CM | POA: Diagnosis present

## 2016-09-18 DIAGNOSIS — I517 Cardiomegaly: Secondary | ICD-10-CM | POA: Diagnosis not present

## 2016-09-18 DIAGNOSIS — D72829 Elevated white blood cell count, unspecified: Secondary | ICD-10-CM | POA: Diagnosis present

## 2016-09-18 DIAGNOSIS — S0990XA Unspecified injury of head, initial encounter: Secondary | ICD-10-CM | POA: Diagnosis not present

## 2016-09-18 DIAGNOSIS — W010XXA Fall on same level from slipping, tripping and stumbling without subsequent striking against object, initial encounter: Secondary | ICD-10-CM | POA: Diagnosis present

## 2016-09-18 DIAGNOSIS — F419 Anxiety disorder, unspecified: Secondary | ICD-10-CM | POA: Diagnosis present

## 2016-09-18 DIAGNOSIS — Z79891 Long term (current) use of opiate analgesic: Secondary | ICD-10-CM | POA: Diagnosis not present

## 2016-09-18 DIAGNOSIS — I5032 Chronic diastolic (congestive) heart failure: Secondary | ICD-10-CM | POA: Diagnosis present

## 2016-09-18 DIAGNOSIS — M25551 Pain in right hip: Secondary | ICD-10-CM

## 2016-09-18 DIAGNOSIS — D72825 Bandemia: Secondary | ICD-10-CM

## 2016-09-18 DIAGNOSIS — I509 Heart failure, unspecified: Secondary | ICD-10-CM | POA: Diagnosis not present

## 2016-09-18 DIAGNOSIS — W19XXXA Unspecified fall, initial encounter: Secondary | ICD-10-CM | POA: Diagnosis not present

## 2016-09-18 DIAGNOSIS — E669 Obesity, unspecified: Secondary | ICD-10-CM | POA: Diagnosis present

## 2016-09-18 DIAGNOSIS — Y92012 Bathroom of single-family (private) house as the place of occurrence of the external cause: Secondary | ICD-10-CM

## 2016-09-18 DIAGNOSIS — Z01811 Encounter for preprocedural respiratory examination: Secondary | ICD-10-CM

## 2016-09-18 DIAGNOSIS — D62 Acute posthemorrhagic anemia: Secondary | ICD-10-CM | POA: Diagnosis not present

## 2016-09-18 DIAGNOSIS — S728X1A Other fracture of right femur, initial encounter for closed fracture: Secondary | ICD-10-CM | POA: Diagnosis not present

## 2016-09-18 DIAGNOSIS — Z6833 Body mass index (BMI) 33.0-33.9, adult: Secondary | ICD-10-CM | POA: Diagnosis not present

## 2016-09-18 DIAGNOSIS — I480 Paroxysmal atrial fibrillation: Secondary | ICD-10-CM | POA: Diagnosis present

## 2016-09-18 DIAGNOSIS — T380X5A Adverse effect of glucocorticoids and synthetic analogues, initial encounter: Secondary | ICD-10-CM | POA: Diagnosis present

## 2016-09-18 DIAGNOSIS — S72144A Nondisplaced intertrochanteric fracture of right femur, initial encounter for closed fracture: Secondary | ICD-10-CM

## 2016-09-18 DIAGNOSIS — G8929 Other chronic pain: Secondary | ICD-10-CM | POA: Diagnosis present

## 2016-09-18 DIAGNOSIS — Z7901 Long term (current) use of anticoagulants: Secondary | ICD-10-CM

## 2016-09-18 DIAGNOSIS — Z01810 Encounter for preprocedural cardiovascular examination: Secondary | ICD-10-CM | POA: Diagnosis not present

## 2016-09-18 DIAGNOSIS — R52 Pain, unspecified: Secondary | ICD-10-CM

## 2016-09-18 DIAGNOSIS — S72009A Fracture of unspecified part of neck of unspecified femur, initial encounter for closed fracture: Secondary | ICD-10-CM | POA: Diagnosis present

## 2016-09-18 DIAGNOSIS — S72141A Displaced intertrochanteric fracture of right femur, initial encounter for closed fracture: Principal | ICD-10-CM | POA: Diagnosis present

## 2016-09-18 DIAGNOSIS — S72001A Fracture of unspecified part of neck of right femur, initial encounter for closed fracture: Secondary | ICD-10-CM | POA: Diagnosis not present

## 2016-09-18 DIAGNOSIS — S72001D Fracture of unspecified part of neck of right femur, subsequent encounter for closed fracture with routine healing: Secondary | ICD-10-CM | POA: Diagnosis not present

## 2016-09-18 LAB — BASIC METABOLIC PANEL
ANION GAP: 7 (ref 5–15)
BUN: 18 mg/dL (ref 6–20)
CALCIUM: 9 mg/dL (ref 8.9–10.3)
CO2: 24 mmol/L (ref 22–32)
CREATININE: 0.99 mg/dL (ref 0.44–1.00)
Chloride: 106 mmol/L (ref 101–111)
GFR calc Af Amer: 60 mL/min — ABNORMAL LOW (ref >60)
GFR, EST NON AFRICAN AMERICAN: 52 mL/min — AB (ref >60)
Glucose, Bld: 131 mg/dL — ABNORMAL HIGH (ref 65–99)
Potassium: 4.2 mmol/L (ref 3.5–5.1)
SODIUM: 137 mmol/L (ref 135–145)

## 2016-09-18 LAB — CBC WITH DIFFERENTIAL/PLATELET
BASOS ABS: 0 10*3/uL (ref 0.0–0.1)
BASOS PCT: 0 %
EOS ABS: 0.1 10*3/uL (ref 0.0–0.7)
Eosinophils Relative: 1 %
HEMATOCRIT: 39.7 % (ref 36.0–46.0)
HEMOGLOBIN: 13.3 g/dL (ref 12.0–15.0)
Lymphocytes Relative: 13 %
Lymphs Abs: 1.8 10*3/uL (ref 0.7–4.0)
MCH: 27.8 pg (ref 26.0–34.0)
MCHC: 33.5 g/dL (ref 30.0–36.0)
MCV: 83.1 fL (ref 78.0–100.0)
Monocytes Absolute: 0.4 10*3/uL (ref 0.1–1.0)
Monocytes Relative: 3 %
NEUTROS ABS: 11.1 10*3/uL — AB (ref 1.7–7.7)
NEUTROS PCT: 83 %
Platelets: 158 10*3/uL (ref 150–400)
RBC: 4.78 MIL/uL (ref 3.87–5.11)
RDW: 13.5 % (ref 11.5–15.5)
WBC: 13.5 10*3/uL — AB (ref 4.0–10.5)

## 2016-09-18 LAB — URINALYSIS, ROUTINE W REFLEX MICROSCOPIC
Bacteria, UA: NONE SEEN
Bilirubin Urine: NEGATIVE
GLUCOSE, UA: NEGATIVE mg/dL
HGB URINE DIPSTICK: NEGATIVE
KETONES UR: NEGATIVE mg/dL
LEUKOCYTES UA: NEGATIVE
Nitrite: NEGATIVE
PH: 8 (ref 5.0–8.0)
PROTEIN: NEGATIVE mg/dL
Specific Gravity, Urine: 1.013 (ref 1.005–1.030)
Squamous Epithelial / LPF: NONE SEEN

## 2016-09-18 LAB — TYPE AND SCREEN
ABO/RH(D): O POS
ANTIBODY SCREEN: NEGATIVE

## 2016-09-18 LAB — PROTIME-INR
INR: 1.32
PROTHROMBIN TIME: 16.5 s — AB (ref 11.4–15.2)

## 2016-09-18 LAB — ABO/RH: ABO/RH(D): O POS

## 2016-09-18 MED ORDER — HYDROCODONE-ACETAMINOPHEN 5-325 MG PO TABS
1.0000 | ORAL_TABLET | Freq: Four times a day (QID) | ORAL | Status: DC | PRN
  Administered 2016-09-19 – 2016-09-21 (×8): 2 via ORAL
  Filled 2016-09-18 (×8): qty 2

## 2016-09-18 MED ORDER — BISACODYL 10 MG RE SUPP: 10.0000 mg | Freq: Every day | RECTAL | Status: DC | PRN

## 2016-09-18 MED ORDER — MIRTAZAPINE 15 MG PO TABS: 30.0000 mg | ORAL_TABLET | Freq: Every day | ORAL | Status: DC

## 2016-09-18 MED ORDER — HYDROMORPHONE HCL 1 MG/ML IJ SOLN
1.0000 mg | Freq: Once | INTRAMUSCULAR | Status: AC
Start: 2016-09-18 — End: 2016-09-18
  Administered 2016-09-18: 1 mg via INTRAVENOUS
  Filled 2016-09-18: qty 1

## 2016-09-18 MED ORDER — SODIUM CHLORIDE 0.9 % IV SOLN
INTRAVENOUS | Status: DC
  Administered 2016-09-18 – 2016-09-19 (×3): via INTRAVENOUS

## 2016-09-18 MED ORDER — HYDROMORPHONE HCL 1 MG/ML IJ SOLN
0.5000 mg | Freq: Once | INTRAMUSCULAR | Status: AC
  Administered 2016-09-18: 0.5 mg via INTRAVENOUS
  Filled 2016-09-18: qty 1

## 2016-09-18 MED ORDER — FAMOTIDINE 20 MG PO TABS
20.0000 mg | ORAL_TABLET | Freq: Every day | ORAL | Status: DC
  Administered 2016-09-19 – 2016-09-22 (×4): 20 mg via ORAL
  Filled 2016-09-18 (×4): qty 1

## 2016-09-18 MED ORDER — METHOCARBAMOL 500 MG PO TABS: 500.0000 mg | ORAL_TABLET | Freq: Four times a day (QID) | ORAL | Status: DC | PRN

## 2016-09-18 MED ORDER — MORPHINE SULFATE (PF) 2 MG/ML IV SOLN
0.5000 mg | INTRAVENOUS | Status: DC | PRN
  Administered 2016-09-18 – 2016-09-19 (×5): 0.5 mg via INTRAVENOUS
  Filled 2016-09-18 (×5): qty 1

## 2016-09-18 MED ORDER — DEXTROSE 5 % IV SOLN
500.0000 mg | Freq: Four times a day (QID) | INTRAVENOUS | Status: DC | PRN
  Administered 2016-09-19: 500 mg via INTRAVENOUS
  Filled 2016-09-18 (×3): qty 5

## 2016-09-18 MED ORDER — SODIUM CHLORIDE 0.9 % IV SOLN
INTRAVENOUS | Status: DC
  Administered 2016-09-18: 19:00:00 via INTRAVENOUS

## 2016-09-18 MED ORDER — POLYETHYLENE GLYCOL 3350 17 G PO PACK: 17.0000 g | PACK | Freq: Every day | ORAL | Status: DC | PRN

## 2016-09-18 MED ORDER — METOPROLOL SUCCINATE ER 50 MG PO TB24
50.0000 mg | ORAL_TABLET | Freq: Every day | ORAL | Status: DC
  Administered 2016-09-19 – 2016-09-22 (×4): 50 mg via ORAL
  Filled 2016-09-18 (×4): qty 1

## 2016-09-18 MED ORDER — AMLODIPINE BESYLATE 5 MG PO TABS
5.0000 mg | ORAL_TABLET | Freq: Every day | ORAL | Status: DC
  Administered 2016-09-19 – 2016-09-22 (×4): 5 mg via ORAL
  Filled 2016-09-18 (×4): qty 1

## 2016-09-18 NOTE — H&P (Signed)
Audrey Burke VQM:[id number] DOB: 09/24/1931 DOA: 09/18/2016     PCP: Georgann Housekeeper, MD   Outpatient Specialists:  Levy Pupa Patient coming from:    home Lives   With family husband    Chief Complaint: Triped and fell now with right hip pain  HPI: Audrey Burke is a 83 y.o. female with medical history significant of HTN, A.fib, CHF anxiety  and hypothyroidism     Presented with right hip pain after fall. Patient reports that she tripped while trying to do laundry falling onto a wooden deck, did not lose consciousness, did not hit her head no headache. She was unable to stand called EMS on arrival she was given fentanyl and improved in pain. Denies chest pain or shortness of breath with ambulation.     Regarding pertinent Chronic problems: Patient is hypertension and is maintained on metoprolol she is on Xarelto but it is unclear why. Review of records has been some questions prior regarding why she is taking it. Patient was not aware of her medical history  The review of records in 2012 she was admitted with A. fib with RVR and was also diagnosed of CHF she has been started on Coumadin and then eventually transitioned to Xarelto. She is rate controlled with metoprolol I do not have any echograms on file  IN ER:  Temp (24hrs), Avg:98.9 F (37.2 C), Min:98.9 F (37.2 C), Max:98.9 F (37.2 C)  98% BP 187/81  Sodium 137 cr 0.99 CBC 13.5 hemoglobin 13.3 CT head no intracranial trauma Plan imaging showing intertrochanteric fracture of the proximal right femur Following Medications were ordered in ER: Medications  0.9 %  sodium chloride infusion ( Intravenous New Bag/Given 09/18/16 1846)  HYDROmorphone (DILAUDID) injection 1 mg (1 mg Intravenous Given 09/18/16 1846)     ER provider discussed case with:  Dr. Despina Hick with orthopedics who will come to see patient in AM recommending:  nothing by mouth post midnight and discontinuation blood thinners  Hospitalist was called  for admission for right intertrochanteric hip fracture resulting from mechanical fall  Review of Systems:    Pertinent positives include: hip pain  Constitutional:  No weight loss, night sweats, Fevers, chills, fatigue, weight loss  HEENT:  No headaches, Difficulty swallowing,Tooth/dental problems,Sore throat,  No sneezing, itching, ear ache, nasal congestion, post nasal drip,  Cardio-vascular:  No chest pain, Orthopnea, PND, anasarca, dizziness, palpitations.no Bilateral lower extremity swelling  GI:  No heartburn, indigestion, abdominal pain, nausea, vomiting, diarrhea, change in bowel habits, loss of appetite, melena, blood in stool, hematemesis Resp:  no shortness of breath at rest. No dyspnea on exertion, No excess mucus, no productive cough, No non-productive cough, No coughing up of blood.No change in color of mucus.No wheezing. Skin:  no rash or lesions. No jaundice GU:  no dysuria, change in color of urine, no urgency or frequency. No straining to urinate.  No flank pain.  Musculoskeletal:  No joint pain or no joint swelling. No decreased range of motion. No back pain.  Psych:  No change in mood or affect. No depression or anxiety. No memory loss.  Neuro: no localizing neurological complaints, no tingling, no weakness, no double vision, no gait abnormality, no slurred speech, no confusion  As per HPI otherwise 10 point review of systems negative.   Past Medical History: Past Medical History:  Diagnosis Date  . Hypertension    History reviewed. No pertinent surgical history.   Social History:  Ambulatory independently     reports that  she has never smoked. She has never used smokeless tobacco. She reports that she does not drink alcohol or use drugs.  Allergies:  No Known Allergies     Family History:   Family History  Problem Relation Age of Onset  . Diabetes Neg Hx   . Cancer Neg Hx   . Hypertension Neg Hx     Medications: Prior to Admission  medications   Medication Sig Start Date End Date Taking? Authorizing Provider  amLODipine (NORVASC) 5 MG tablet Take 1 tablet (5 mg total) by mouth daily. PATIENT NEEDS OFFICE VISIT FOR ADDITIONAL REFILLS - 2nd NOTICE 05/03/13   Sondra Barges, PA-C  cyclobenzaprine (FLEXERIL) 10 MG tablet Take 0.5 tablets (5 mg total) by mouth 2 (two) times daily as needed for muscle spasms. 05/19/15   Mady Gemma, PA-C  diclofenac sodium (VOLTAREN) 1 % GEL Apply 2 g topically 4 (four) times daily. 05/23/15   Sherren Mocha, MD  furosemide (LASIX) 20 MG tablet Take 20 mg by mouth 2 (two) times daily.    Historical Provider, MD  gabapentin (NEURONTIN) 800 MG tablet Take 800 mg by mouth daily. 05/08/15   Historical Provider, MD  metoprolol succinate (TOPROL-XL) 50 MG 24 hr tablet Take 1 tablet (50 mg total) by mouth daily. PATIENT NEEDS OFFICE VISIT FOR ADDITIONAL REFILLS - 2nd NOTICE 05/03/13   Sondra Barges, PA-C  mirtazapine (REMERON) 30 MG tablet Take 30 mg by mouth at bedtime. 05/08/15   Historical Provider, MD  olmesartan (BENICAR) 40 MG tablet Take 1 tablet (40 mg total) by mouth daily. Needs office visit-no further refills 03/01/13   Sondra Barges, PA-C  oxyCODONE-acetaminophen (PERCOCET/ROXICET) 5-325 MG tablet Take 1 tablet by mouth every 4 (four) hours as needed for severe pain. 05/27/15   Elpidio Anis, PA-C  Rivaroxaban (XARELTO) 20 MG TABS Take 20 mg by mouth daily.    Historical Provider, MD    Physical Exam: Patient Vitals for the past 24 hrs:  BP Temp Pulse Resp SpO2  09/18/16 1636 187/81 98.9 F (37.2 C) 80 16 98 %    1. General:  in No Acute distress 2. Psychological: Alert and  Oriented 3. Head/ENT:    Dry Mucous Membranes                          Head Non traumatic, neck supple                           Poor Dentition 4. SKIN:  decreased Skin turgor,  Skin clean Dry and intact no rash 5. Heart: Regular rate and rhythm no Murmur, Rub or gallop 6. Lungs: Clear to auscultation bilaterally, no wheezes or  crackles   7. Abdomen: Soft,  non-tender, Non distended obese 8. Lower extremities: no clubbing, cyanosis, or edema 9. Neurologically Grossly intact, moving all 4 extremities equally   10. MSK: Normal range of motionExcept for right hip secondary to pain   body mass index is unknown because there is no height or weight on file.  Labs on Admission:   Labs on Admission: I have personally reviewed following labs and imaging studies  CBC:  Recent Labs Lab 09/18/16 1715  WBC 13.5*  NEUTROABS 11.1*  HGB 13.3  HCT 39.7  MCV 83.1  PLT 158   Basic Metabolic Panel:  Recent Labs Lab 09/18/16 1715  NA 137  K 4.2  CL 106  CO2  24  GLUCOSE 131*  BUN 18  CREATININE 0.99  CALCIUM 9.0   GFR: CrCl cannot be calculated (Unknown ideal weight.). Liver Function Tests: No results for input(s): AST, ALT, ALKPHOS, BILITOT, PROT, ALBUMIN in the last 168 hours. No results for input(s): LIPASE, AMYLASE in the last 168 hours. No results for input(s): AMMONIA in the last 168 hours. Coagulation Profile:  Recent Labs Lab 09/18/16 1715  INR 1.32   Cardiac Enzymes: No results for input(s): CKTOTAL, CKMB, CKMBINDEX, TROPONINI in the last 168 hours. BNP (last 3 results) No results for input(s): PROBNP in the last 8760 hours. HbA1C: No results for input(s): HGBA1C in the last 72 hours. CBG: No results for input(s): GLUCAP in the last 168 hours. Lipid Profile: No results for input(s): CHOL, HDL, LDLCALC, TRIG, CHOLHDL, LDLDIRECT in the last 72 hours. Thyroid Function Tests: No results for input(s): TSH, T4TOTAL, FREET4, T3FREE, THYROIDAB in the last 72 hours. Anemia Panel: No results for input(s): VITAMINB12, FOLATE, FERRITIN, TIBC, IRON, RETICCTPCT in the last 72 hours.  Sepsis Labs: @LABRCNTIP (procalcitonin:4,lacticidven:4) )No results found for this or any previous visit (from the past 240 hour(s)).     UA  ordered  Lab Results  Component Value Date   HGBA1C 5.5 04/18/2012      CrCl cannot be calculated (Unknown ideal weight.).  BNP (last 3 results) No results for input(s): PROBNP in the last 8760 hours.   ECG REPORT  Independently reviewed Rate: 83  Rhythm: Sinus rhythm ST&T Change: No acute ischemic changes  QTC 501  There were no vitals filed for this visit.   Cultures:    Component Value Date/Time   SDES URINE, CLEAN CATCH 05/26/2015 2104   SPECREQUEST Normal 05/26/2015 2104   CULT  05/26/2015 2104    MULTIPLE SPECIES PRESENT, SUGGEST RECOLLECTION Performed at [facility]    REPTSTATUS 05/28/2015 FINAL 05/26/2015 2104     Radiological Exams on Admission: Dg Knee 1-2 Views Right  Result Date: 09/18/2016 CLINICAL DATA:  RIGHT hip fracture. EXAM: RIGHT KNEE - 1-2 VIEW COMPARISON:  None FINDINGS: Two views of the RIGHT knee in nonstandard orientation. No clear evidence of fracture. There is narrowing of the medial compartment. No joint effusion. IMPRESSION: No gross evidence of RIGHT knee fracture. Electronically Signed   By: Genevive Bi M.D.   On: 09/18/2016 17:44   Ct Head Wo Contrast  Result Date: 09/18/2016 CLINICAL DATA:  Trip and fell onto wooden deck, no LOC. No head injury EXAM: CT HEAD WITHOUT CONTRAST TECHNIQUE: Contiguous axial images were obtained from the base of the skull through the vertex without intravenous contrast. COMPARISON:  Head CT 06/05/2011 FINDINGS: Brain: No acute intracranial hemorrhage. No focal mass lesion. No CT evidence of acute infarction. No midline shift or mass effect. No hydrocephalus. Basilar cisterns are patent. Calcifications within LEFT RIGHT basal ganglia unchanged from comparison CT. Moderate periventricular white matter hypodensity. Moderate atrophy. Head motion degrades skullbase evaluation. Vascular: No hyperdense vessel or unexpected calcification. Skull: Normal. Negative for fracture or focal lesion. Head motion does degrade skullbase evaluation Sinuses/Orbits: Paranasal sinuses and mastoid  air cells are clear. Orbits are clear. Other: None. IMPRESSION: 1. No intracranial trauma. 2. Head motion degrades skullbase evaluation. 3. Atrophy and white matter microvascular disease. Electronically Signed   By: Genevive Bi M.D.   On: 09/18/2016 18:15   Dg Chest Port 1 View  Result Date: 09/18/2016 CLINICAL DATA:  Initial evaluation for preoperative evaluation. EXAM: PORTABLE CHEST 1 VIEW COMPARISON:  Prior radiograph from  06/20/2011. FINDINGS: Moderate cardiomegaly, stable from previous. Mediastinal silhouette within normal limits. Lungs mildly hypoinflated. Central perihilar vascular congestion without overt pulmonary edema. No pleural effusion. No focal infiltrates. No pneumothorax. No acute osseous abnormality. IMPRESSION: 1. Cardiomegaly with mild central perihilar vascular congestion without overt pulmonary edema. 2. No other active cardiopulmonary disease. Electronically Signed   By: Rise MuBenjamin  McClintock M.D.   On: 09/18/2016 20:14   Dg Hip Unilat  With Pelvis 2-3 Views Right  Result Date: 09/18/2016 CLINICAL DATA:  Pt c/o severe RIGHT hip pain s/p tripping over step from laundry room into kitchen at home this evening, denies prior fx's this leg. EXAM: DG HIP (WITH OR WITHOUT PELVIS) 2-3V RIGHT COMPARISON:  None. FINDINGS: Intertrochanteric fracture of the proximal RIGHT femur with varus angulation. The femoral head is located. No pelvic fracture. IMPRESSION: Intertrochanteric fracture proximal RIGHT femur. Electronically Signed   By: Genevive BiStewart  Edmunds M.D.   On: 09/18/2016 17:40    Chart has been reviewed    Assessment/Plan  83 y.o. female with medical history significant of HTN, A.fib, CHF anxiety  and hypothyroidism admitted for right hip fracture  Present on Admission: . Closed right hip fracture (HCC) - admit pare hip fracture protocol. Nothing by mouth post midnight. Orthopedics to see in the morning. Hold Xarelto, given advanced age patient is moderate risk she denies any  chest pain or shortness of breath able to tolerate walking the stairs and ambulating without any symptoms. EKG nonischemic. No father workup indicated prior to proceeding to or . Leukocytosis - likely stress related no evidence of infection at this point  . Paroxysmal a-fib (HCC) -  .Chronic Atrial fibrillation (HCC)        - CHA2DS2 vas score 5  : hold Xarelto   Currently in sinus rhythm        -  Rate control:  Currently controlled with  Metoprolol     History of CHF prior to or will obtain echogram, hold Lasix for now well has decreased by mouth intake, avoid fluid overload Prolonged Qt - - will monitor on tele avoid QT prolonging medications, rehydrate correct electrolytes  Other plan as per orders.  DVT prophylaxis:  SCD    Code Status:  FULL CODE  as per patient   Family Communication:   Family   at  Bedside patient speaks some [place] but mainly [place]   Disposition Plan:     likely will need placement for rehabilitation                             Would benefit from PT/OT eval prior to DC                        Social Work  consulted                          Consults called: Dr. Despina HickAlusio with orthopedics    Admission status:   inpatient      Level of care    medical floor       I have spent a total of  56 min on this admission  Iniko Robles 09/18/2016, 8:53 PM    [facility]  Pager [phone]   after 2 AM please page floor coverage PA If 7AM-7PM, please contact the day team taking care of the patient  [email or web address]  Password [email or web address]

## 2016-09-18 NOTE — ED Notes (Signed)
Timer started for pt to go to 4E

## 2016-09-18 NOTE — ED Notes (Signed)
Bed: [id number] Expected date:  Expected time:  Means of arrival:  Comments: 83 yo fall; hip pain

## 2016-09-18 NOTE — ED Notes (Signed)
Patient transported to CT 

## 2016-09-18 NOTE — ED Triage Notes (Addendum)
EMS reports pt fell at home, rt hip injury, IV # 20 R AC, 200 mcg Fentanyl given by EMS, Trip and fall onto wooden deck, no LOC

## 2016-09-18 NOTE — ED Provider Notes (Addendum)
[facility]-EMERGENCY DEPT Provider Note   CSN: [id number] Arrival date & time: 09/18/16  1629     History   Chief Complaint Chief Complaint  Patient presents with  . Fall  . Hip Pain    HPI Audrey Burke is a 83 y.o. female.  HPI Patient presents to the emergency room for evaluation of right hip pain. Patient was walking down a step when she tripped and fell landing on her right hip.  Patient not sure if she hit her head but denies any headache. She denies loss of consciousness. The pain in her hip is severe she was unable to stand. She called EMS.  EMS gave her fentanyl,  patient's pain has improved. She denies any other complaints or injuries.  Past Medical History:  Diagnosis Date  . Hypertension     Patient Active Problem List   Diagnosis Date Noted  . HTN (hypertension) 05/14/2012  . Chronic pain 05/14/2012    History reviewed. No pertinent surgical history.  OB History    No data available       Home Medications    Prior to Admission medications   Medication Sig Start Date End Date Taking? Authorizing Provider  amLODipine (NORVASC) 5 MG tablet Take 1 tablet (5 mg total) by mouth daily. PATIENT NEEDS OFFICE VISIT FOR ADDITIONAL REFILLS - 2nd NOTICE 05/03/13   Sondra Bargesyan M Dunn, PA-C  cyclobenzaprine (FLEXERIL) 10 MG tablet Take 0.5 tablets (5 mg total) by mouth 2 (two) times daily as needed for muscle spasms. 05/19/15   Mady GemmaElizabeth C Westfall, PA-C  diclofenac sodium (VOLTAREN) 1 % GEL Apply 2 g topically 4 (four) times daily. 05/23/15   Sherren MochaEva N Shaw, MD  furosemide (LASIX) 20 MG tablet Take 20 mg by mouth 2 (two) times daily.    Historical Provider, MD  gabapentin (NEURONTIN) 800 MG tablet Take 800 mg by mouth daily. 05/08/15   Historical Provider, MD  metoprolol succinate (TOPROL-XL) 50 MG 24 hr tablet Take 1 tablet (50 mg total) by mouth daily. PATIENT NEEDS OFFICE VISIT FOR ADDITIONAL REFILLS - 2nd NOTICE 05/03/13   Sondra Bargesyan M Dunn, PA-C  mirtazapine (REMERON) 30 MG tablet Take  30 mg by mouth at bedtime. 05/08/15   Historical Provider, MD  olmesartan (BENICAR) 40 MG tablet Take 1 tablet (40 mg total) by mouth daily. Needs office visit-no further refills 03/01/13   Sondra Bargesyan M Dunn, PA-C  oxyCODONE-acetaminophen (PERCOCET/ROXICET) 5-325 MG tablet Take 1 tablet by mouth every 4 (four) hours as needed for severe pain. 05/27/15   Elpidio AnisShari Upstill, PA-C  Rivaroxaban (XARELTO) 20 MG TABS Take 20 mg by mouth daily.    Historical Provider, MD    Family History Family History  Problem Relation Age of Onset  . Family history unknown: Yes    Social History Social History  Substance Use Topics  . Smoking status: Never Smoker  . Smokeless tobacco: Never Used  . Alcohol use No     Allergies   Patient has no known allergies.   Review of Systems Review of Systems  All other systems reviewed and are negative.    Physical Exam Updated Vital Signs BP 187/81   Pulse 80   Temp 98.9 F (37.2 C)   Resp 16   SpO2 98%   Physical Exam  Constitutional: She appears well-developed and well-nourished. No distress.  HENT:  Head: Normocephalic and atraumatic.  Right Ear: External ear normal.  Left Ear: External ear normal.  Eyes: Conjunctivae are normal. Right eye exhibits no discharge.  Left eye exhibits no discharge. No scleral icterus.  Neck: Neck supple. No tracheal deviation present.  Cardiovascular: Normal rate, regular rhythm and intact distal pulses.   Pulmonary/Chest: Effort normal and breath sounds normal. No stridor. No respiratory distress. She has no wheezes. She has no rales.  Abdominal: Soft. Bowel sounds are normal. She exhibits no distension. There is no tenderness. There is no rebound and no guarding.  Musculoskeletal: She exhibits tenderness. She exhibits no edema.       Right hip: She exhibits tenderness and bony tenderness.  No ttp right knee although knee is flexed, pt does not want to extend the knee; ?shortening of RLE  Neurological: She is alert. She has  normal strength. No cranial nerve deficit (no facial droop, extraocular movements intact, no slurred speech) or sensory deficit. She exhibits normal muscle tone. She displays no seizure activity. Coordination normal.  Skin: Skin is warm and dry. No rash noted.  Psychiatric: She has a normal mood and affect.  Nursing note and vitals reviewed.    ED Treatments / Results  Labs (all labs ordered are listed, but only abnormal results are displayed) Labs Reviewed  BASIC METABOLIC PANEL - Abnormal; Notable for the following:       Result Value   Glucose, Bld 131 (*)    GFR calc non Af Amer 52 (*)    GFR calc Af Amer 60 (*)    All other components within normal limits  CBC WITH DIFFERENTIAL/PLATELET - Abnormal; Notable for the following:    WBC 13.5 (*)    Neutro Abs 11.1 (*)    All other components within normal limits  PROTIME-INR - Abnormal; Notable for the following:    Prothrombin Time 16.5 (*)    All other components within normal limits  TYPE AND SCREEN  ABO/RH     Radiology Dg Knee 1-2 Views Right  Result Date: 09/18/2016 CLINICAL DATA:  RIGHT hip fracture. EXAM: RIGHT KNEE - 1-2 VIEW COMPARISON:  None FINDINGS: Two views of the RIGHT knee in nonstandard orientation. No clear evidence of fracture. There is narrowing of the medial compartment. No joint effusion. IMPRESSION: No gross evidence of RIGHT knee fracture. Electronically Signed   By: Genevive Bi M.D.   On: 09/18/2016 17:44   Ct Head Wo Contrast  Result Date: 09/18/2016 CLINICAL DATA:  Trip and fell onto wooden deck, no LOC. No head injury EXAM: CT HEAD WITHOUT CONTRAST TECHNIQUE: Contiguous axial images were obtained from the base of the skull through the vertex without intravenous contrast. COMPARISON:  Head CT 06/05/2011 FINDINGS: Brain: No acute intracranial hemorrhage. No focal mass lesion. No CT evidence of acute infarction. No midline shift or mass effect. No hydrocephalus. Basilar cisterns are patent.  Calcifications within LEFT RIGHT basal ganglia unchanged from comparison CT. Moderate periventricular white matter hypodensity. Moderate atrophy. Head motion degrades skullbase evaluation. Vascular: No hyperdense vessel or unexpected calcification. Skull: Normal. Negative for fracture or focal lesion. Head motion does degrade skullbase evaluation Sinuses/Orbits: Paranasal sinuses and mastoid air cells are clear. Orbits are clear. Other: None. IMPRESSION: 1. No intracranial trauma. 2. Head motion degrades skullbase evaluation. 3. Atrophy and white matter microvascular disease. Electronically Signed   By: Genevive Bi M.D.   On: 09/18/2016 18:15   Dg Hip Unilat  With Pelvis 2-3 Views Right  Result Date: 09/18/2016 CLINICAL DATA:  Pt c/o severe RIGHT hip pain s/p tripping over step from laundry room into kitchen at home this evening, denies prior fx's this leg.  EXAM: DG HIP (WITH OR WITHOUT PELVIS) 2-3V RIGHT COMPARISON:  None. FINDINGS: Intertrochanteric fracture of the proximal RIGHT femur with varus angulation. The femoral head is located. No pelvic fracture. IMPRESSION: Intertrochanteric fracture proximal RIGHT femur. Electronically Signed   By: Genevive Bi M.D.   On: 09/18/2016 17:40    Procedures Procedures (including critical care time)  Medications Ordered in ED Medications  0.9 %  sodium chloride infusion (not administered)     Initial Impression / Assessment and Plan / ED Course  I have reviewed the triage vital signs and the nursing notes.  Pertinent labs & imaging results that were available during my care of the patient were reviewed by me and considered in my medical decision making (see chart for details).   patient's hip x-ray shows an intratrochanteric fracture of the right hip. Patient denies any other injuries. Laboratory tests are unremarkable.  Plan on consultation with orthopedics and admission to the medical service for further treatment of her acute hip  fracture  Final Clinical Impressions(s) / ED Diagnoses   Final diagnoses:  Closed nondisplaced intertrochanteric fracture of right femur, initial encounter [facility])    New Prescriptions New Prescriptions   No medications on file     Linwood Dibbles, MD 09/18/16 1824  I discussed the case with Dr. Lequita Halt. Plan on surgery tomorrow. He will see the patient in the emergency room. Hold his Xarelto tomorrow and make her nothing by mouth after midnight.   Linwood Dibbles, MD 09/18/16 1903  Pt is not sure why she is on xarelto.  She states it is to thin the blood but denies history of DVT, PE or a fib    Linwood Dibbles, MD 09/18/16 1918

## 2016-09-19 ENCOUNTER — Inpatient Hospital Stay (HOSPITAL_COMMUNITY): Payer: Medicare Other

## 2016-09-19 ENCOUNTER — Encounter (HOSPITAL_COMMUNITY)
Admission: EM | Disposition: A | Discharge status: Skilled Nursing Facility | Payer: Self-pay | Source: Home / Self Care | Attending: Family Medicine

## 2016-09-19 ENCOUNTER — Encounter (HOSPITAL_COMMUNITY): Payer: Self-pay | Admitting: *Deleted

## 2016-09-19 ENCOUNTER — Inpatient Hospital Stay (HOSPITAL_COMMUNITY): Payer: Medicare Other | Admitting: Certified Registered Nurse Anesthetist

## 2016-09-19 DIAGNOSIS — Z01810 Encounter for preprocedural cardiovascular examination: Secondary | ICD-10-CM

## 2016-09-19 DIAGNOSIS — S72001D Fracture of unspecified part of neck of right femur, subsequent encounter for closed fracture with routine healing: Secondary | ICD-10-CM

## 2016-09-19 DIAGNOSIS — D72829 Elevated white blood cell count, unspecified: Secondary | ICD-10-CM

## 2016-09-19 HISTORY — PX: FEMUR IM NAIL: SHX1597

## 2016-09-19 LAB — BASIC METABOLIC PANEL
Anion gap: 7 (ref 5–15)
BUN: 15 mg/dL (ref 6–20)
CALCIUM: 8.7 mg/dL — AB (ref 8.9–10.3)
CHLORIDE: 106 mmol/L (ref 101–111)
CO2: 25 mmol/L (ref 22–32)
CREATININE: 0.69 mg/dL (ref 0.44–1.00)
GFR calc non Af Amer: >60 mL/min (ref >60)
Glucose, Bld: 136 mg/dL — ABNORMAL HIGH (ref 65–99)
Potassium: 4.1 mmol/L (ref 3.5–5.1)
Sodium: 138 mmol/L (ref 135–145)

## 2016-09-19 LAB — CBC
HEMATOCRIT: 38.7 % (ref 36.0–46.0)
HEMOGLOBIN: 12.7 g/dL (ref 12.0–15.0)
MCH: 27.7 pg (ref 26.0–34.0)
MCHC: 32.8 g/dL (ref 30.0–36.0)
MCV: 84.5 fL (ref 78.0–100.0)
Platelets: 149 10*3/uL — ABNORMAL LOW (ref 150–400)
RBC: 4.58 MIL/uL (ref 3.87–5.11)
RDW: 14 % (ref 11.5–15.5)
WBC: 10.9 10*3/uL — ABNORMAL HIGH (ref 4.0–10.5)

## 2016-09-19 LAB — ECHOCARDIOGRAM COMPLETE
HEIGHTINCHES: 63 in
Weight: 3065.28 oz

## 2016-09-19 LAB — ALBUMIN: Albumin: 3.5 g/dL (ref 3.5–5.0)

## 2016-09-19 LAB — SURGICAL PCR SCREEN
MRSA, PCR: NEGATIVE
Staphylococcus aureus: POSITIVE — AB

## 2016-09-19 SURGERY — INSERTION, INTRAMEDULLARY ROD, FEMUR
Anesthesia: General | Site: Hip | Laterality: Right

## 2016-09-19 MED ORDER — ONDANSETRON HCL 4 MG/2ML IJ SOLN
INTRAMUSCULAR | Status: AC
  Filled 2016-09-19: qty 2

## 2016-09-19 MED ORDER — DEXAMETHASONE SODIUM PHOSPHATE 10 MG/ML IJ SOLN
INTRAMUSCULAR | Status: AC
  Filled 2016-09-19: qty 1

## 2016-09-19 MED ORDER — ACETAMINOPHEN 650 MG RE SUPP: 650.0000 mg | Freq: Four times a day (QID) | RECTAL | Status: DC | PRN

## 2016-09-19 MED ORDER — FENTANYL CITRATE (PF) 100 MCG/2ML IJ SOLN
INTRAMUSCULAR | Status: AC
  Filled 2016-09-19: qty 2

## 2016-09-19 MED ORDER — LIDOCAINE 2% (20 MG/ML) 5 ML SYRINGE
INTRAMUSCULAR | Status: DC | PRN
  Administered 2016-09-19: 100 mg via INTRAVENOUS

## 2016-09-19 MED ORDER — SUGAMMADEX SODIUM 200 MG/2ML IV SOLN
INTRAVENOUS | Status: DC | PRN
  Administered 2016-09-19: 200 mg via INTRAVENOUS

## 2016-09-19 MED ORDER — TRAMADOL HCL 50 MG PO TABS
50.0000 mg | ORAL_TABLET | Freq: Four times a day (QID) | ORAL | Status: DC | PRN
  Administered 2016-09-22: 100 mg via ORAL
  Filled 2016-09-19: qty 2

## 2016-09-19 MED ORDER — EPHEDRINE SULFATE-NACL 50-0.9 MG/10ML-% IV SOSY
PREFILLED_SYRINGE | INTRAVENOUS | Status: DC | PRN
  Administered 2016-09-19: 10 mg via INTRAVENOUS

## 2016-09-19 MED ORDER — PHENOL 1.4 % MT LIQD: 1.0000 | OROMUCOSAL | Status: DC | PRN

## 2016-09-19 MED ORDER — CEFAZOLIN SODIUM-DEXTROSE 2-4 GM/100ML-% IV SOLN
2.0000 g | INTRAVENOUS | Status: AC
  Administered 2016-09-19: 2 g via INTRAVENOUS

## 2016-09-19 MED ORDER — SUCCINYLCHOLINE CHLORIDE 200 MG/10ML IV SOSY
PREFILLED_SYRINGE | INTRAVENOUS | Status: DC | PRN
  Administered 2016-09-19: 100 mg via INTRAVENOUS

## 2016-09-19 MED ORDER — LACTATED RINGERS IV SOLN: INTRAVENOUS | Status: DC

## 2016-09-19 MED ORDER — LIDOCAINE 2% (20 MG/ML) 5 ML SYRINGE
INTRAMUSCULAR | Status: AC
Start: 2016-09-19 — End: 2016-09-19
  Filled 2016-09-19: qty 5

## 2016-09-19 MED ORDER — PROPOFOL 10 MG/ML IV BOLUS
INTRAVENOUS | Status: AC
  Filled 2016-09-19: qty 20

## 2016-09-19 MED ORDER — ALBUMIN HUMAN 5 % IV SOLN
INTRAVENOUS | Status: DC | PRN
  Administered 2016-09-19: 18:00:00 via INTRAVENOUS

## 2016-09-19 MED ORDER — ENOXAPARIN SODIUM 40 MG/0.4ML ~~LOC~~ SOLN
40.0000 mg | SUBCUTANEOUS | Status: DC
  Administered 2016-09-20 – 2016-09-22 (×3): 40 mg via SUBCUTANEOUS
  Filled 2016-09-19 (×3): qty 0.4

## 2016-09-19 MED ORDER — LACTATED RINGERS IV SOLN
INTRAVENOUS | Status: DC | PRN
  Administered 2016-09-19: 17:00:00 via INTRAVENOUS

## 2016-09-19 MED ORDER — SUCCINYLCHOLINE CHLORIDE 200 MG/10ML IV SOSY
PREFILLED_SYRINGE | INTRAVENOUS | Status: AC
  Filled 2016-09-19: qty 10

## 2016-09-19 MED ORDER — MENTHOL 3 MG MT LOZG: 1.0000 | LOZENGE | OROMUCOSAL | Status: DC | PRN

## 2016-09-19 MED ORDER — CHLORHEXIDINE GLUCONATE 4 % EX LIQD
60.0000 mL | Freq: Once | CUTANEOUS | Status: AC
  Administered 2016-09-19: 4 via TOPICAL
  Filled 2016-09-19: qty 60

## 2016-09-19 MED ORDER — SUGAMMADEX SODIUM 200 MG/2ML IV SOLN
INTRAVENOUS | Status: AC
  Filled 2016-09-19: qty 2

## 2016-09-19 MED ORDER — PROPOFOL 10 MG/ML IV BOLUS
INTRAVENOUS | Status: DC | PRN
  Administered 2016-09-19: 30 mg via INTRAVENOUS
  Administered 2016-09-19: 100 mg via INTRAVENOUS

## 2016-09-19 MED ORDER — ACETAMINOPHEN 10 MG/ML IV SOLN
1000.0000 mg | Freq: Once | INTRAVENOUS | Status: AC
  Administered 2016-09-19: 1000 mg via INTRAVENOUS

## 2016-09-19 MED ORDER — ROCURONIUM BROMIDE 10 MG/ML (PF) SYRINGE
PREFILLED_SYRINGE | INTRAVENOUS | Status: DC | PRN
  Administered 2016-09-19: 20 mg via INTRAVENOUS

## 2016-09-19 MED ORDER — CEFAZOLIN SODIUM-DEXTROSE 2-4 GM/100ML-% IV SOLN
2.0000 g | Freq: Four times a day (QID) | INTRAVENOUS | Status: AC
  Administered 2016-09-19 – 2016-09-20 (×2): 2 g via INTRAVENOUS
  Filled 2016-09-19 (×2): qty 100

## 2016-09-19 MED ORDER — HYDROMORPHONE HCL 1 MG/ML IJ SOLN
0.2500 mg | INTRAMUSCULAR | Status: DC | PRN
  Administered 2016-09-19 (×2): 0.5 mg via INTRAVENOUS

## 2016-09-19 MED ORDER — HYDROMORPHONE HCL 1 MG/ML IJ SOLN
INTRAMUSCULAR | Status: AC
  Administered 2016-09-19: 0.5 mg via INTRAVENOUS
  Filled 2016-09-19: qty 1

## 2016-09-19 MED ORDER — PHENYLEPHRINE 40 MCG/ML (10ML) SYRINGE FOR IV PUSH (FOR BLOOD PRESSURE SUPPORT)
PREFILLED_SYRINGE | INTRAVENOUS | Status: DC | PRN
  Administered 2016-09-19: 160 ug via INTRAVENOUS

## 2016-09-19 MED ORDER — MORPHINE SULFATE (PF) 2 MG/ML IV SOLN
1.0000 mg | INTRAVENOUS | Status: DC | PRN
  Administered 2016-09-19 – 2016-09-20 (×3): 1 mg via INTRAVENOUS
  Filled 2016-09-19 (×3): qty 1

## 2016-09-19 MED ORDER — FENTANYL CITRATE (PF) 100 MCG/2ML IJ SOLN
INTRAMUSCULAR | Status: DC | PRN
  Administered 2016-09-19 (×2): 50 ug via INTRAVENOUS

## 2016-09-19 MED ORDER — ACETAMINOPHEN 325 MG PO TABS: 650.0000 mg | ORAL_TABLET | Freq: Four times a day (QID) | ORAL | Status: DC | PRN

## 2016-09-19 MED ORDER — ONDANSETRON HCL 4 MG/2ML IJ SOLN
INTRAMUSCULAR | Status: DC | PRN
  Administered 2016-09-19: 4 mg via INTRAVENOUS

## 2016-09-19 MED ORDER — ACETAMINOPHEN 10 MG/ML IV SOLN
INTRAVENOUS | Status: AC
  Filled 2016-09-19: qty 100

## 2016-09-19 MED ORDER — ALBUMIN HUMAN 5 % IV SOLN
INTRAVENOUS | Status: AC
  Filled 2016-09-19: qty 250

## 2016-09-19 MED ORDER — DEXAMETHASONE SODIUM PHOSPHATE 10 MG/ML IJ SOLN
8.0000 mg | Freq: Once | INTRAMUSCULAR | Status: AC
  Administered 2016-09-19: 8 mg via INTRAVENOUS

## 2016-09-19 MED ORDER — POVIDONE-IODINE 10 % EX SWAB
2.0000 "application " | Freq: Once | CUTANEOUS | Status: AC
  Administered 2016-09-19: 2 via TOPICAL

## 2016-09-19 MED ORDER — CEFAZOLIN SODIUM-DEXTROSE 2-4 GM/100ML-% IV SOLN
INTRAVENOUS | Status: AC
  Filled 2016-09-19: qty 100

## 2016-09-19 MED ORDER — LACTATED RINGERS IV SOLN
INTRAVENOUS | Status: DC
  Administered 2016-09-19: 17:00:00 via INTRAVENOUS

## 2016-09-19 MED ORDER — FENTANYL CITRATE (PF) 100 MCG/2ML IJ SOLN
100.0000 ug | Freq: Once | INTRAMUSCULAR | Status: AC
  Administered 2016-09-19: 50 ug via INTRAVENOUS

## 2016-09-19 MED ORDER — 0.9 % SODIUM CHLORIDE (POUR BTL) OPTIME
TOPICAL | Status: DC | PRN
  Administered 2016-09-19: 1000 mL

## 2016-09-19 MED ORDER — HYDRALAZINE HCL 20 MG/ML IJ SOLN
10.0000 mg | INTRAMUSCULAR | Status: DC | PRN
  Administered 2016-09-19 – 2016-09-20 (×3): 10 mg via INTRAVENOUS
  Filled 2016-09-19 (×3): qty 1

## 2016-09-19 SURGICAL SUPPLY — 34 items
BAG ZIPLOCK 12X15 (MISCELLANEOUS) ×2 IMPLANT
BIT DRILL CANN LG 4.3MM (BIT) ×1 IMPLANT
BNDG COHESIVE 6X5 TAN STRL LF (GAUZE/BANDAGES/DRESSINGS) ×2 IMPLANT
COVER BACK TABLE 60X90IN (DRAPES) ×2 IMPLANT
COVER PERINEAL POST (MISCELLANEOUS) ×2 IMPLANT
DRAPE STERI IOBAN 125X83 (DRAPES) ×2 IMPLANT
DRILL BIT CANN LG 4.3MM (BIT) ×2
DRSG MEPILEX BORDER 4X4 (GAUZE/BANDAGES/DRESSINGS) ×2 IMPLANT
DRSG MEPILEX BORDER 4X8 (GAUZE/BANDAGES/DRESSINGS) ×2 IMPLANT
DRSG PAD ABDOMINAL 8X10 ST (GAUZE/BANDAGES/DRESSINGS) ×2 IMPLANT
DURAPREP 26ML APPLICATOR (WOUND CARE) ×2 IMPLANT
ELECT REM PT RETURN 9FT ADLT (ELECTROSURGICAL) ×2
ELECTRODE REM PT RTRN 9FT ADLT (ELECTROSURGICAL) ×1 IMPLANT
GAUZE SPONGE 4X4 12PLY STRL (GAUZE/BANDAGES/DRESSINGS) ×2 IMPLANT
GLOVE BIO SURGEON STRL SZ7.5 (GLOVE) ×2 IMPLANT
GLOVE BIO SURGEON STRL SZ8 (GLOVE) ×2 IMPLANT
GLOVE BIOGEL PI IND STRL 8 (GLOVE) ×2 IMPLANT
GLOVE BIOGEL PI INDICATOR 8 (GLOVE) ×2
GOWN STRL REUS W/TWL LRG LVL3 (GOWN DISPOSABLE) ×2 IMPLANT
GOWN STRL REUS W/TWL XL LVL3 (GOWN DISPOSABLE) ×2 IMPLANT
GUIDEPIN 3.2X17.5 THRD DISP (PIN) ×2 IMPLANT
KIT BASIN OR (CUSTOM PROCEDURE TRAY) ×2 IMPLANT
MANIFOLD NEPTUNE II (INSTRUMENTS) ×2 IMPLANT
NAIL HIP FRACT 130D 11X180 (Screw) ×2 IMPLANT
NS IRRIG 1000ML POUR BTL (IV SOLUTION) ×2 IMPLANT
PACK GENERAL/GYN (CUSTOM PROCEDURE TRAY) ×2 IMPLANT
PADDING CAST COTTON 6X4 STRL (CAST SUPPLIES) ×4 IMPLANT
POSITIONER SURGICAL ARM (MISCELLANEOUS) ×2 IMPLANT
SCREW BONE CORTICAL 5.0X38 (Screw) ×2 IMPLANT
SCREW LAG HIP NAIL 10.5X95 (Screw) ×2 IMPLANT
SUT VIC AB 0 CT1 27 (SUTURE) ×1
SUT VIC AB 0 CT1 27XBRD ANTBC (SUTURE) ×1 IMPLANT
TOWEL OR 17X26 10 PK STRL BLUE (TOWEL DISPOSABLE) ×4 IMPLANT
TRAY FOLEY W/METER SILVER 16FR (SET/KITS/TRAYS/PACK) IMPLANT

## 2016-09-19 NOTE — Anesthesia Postprocedure Evaluation (Signed)
Anesthesia Post Note  Patient: Physiological scientistvangilia Bennison  Procedure(s) Performed: Procedure(s) (LRB): INTRAMEDULLARY (IM) NAIL FEMORAL (Right)  Patient location during evaluation: PACU Anesthesia Type: General Level of consciousness: awake Vital Signs Assessment: post-procedure vital signs reviewed and stable Respiratory status: spontaneous breathing Cardiovascular status: stable Anesthetic complications: no       Last Vitals:  Vitals:   09/19/16 1540 09/19/16 1830  BP: (!) 172/61 (!) 181/62  Pulse: 78 82  Resp:  15  Temp:  37.2 C    Last Pain:  Vitals:   09/19/16 1422  TempSrc:   PainSc: 6                  Nthony Lefferts

## 2016-09-19 NOTE — H&P (View-Only) (Signed)
Reason for Consult:Right femur fracture Referring Physician: Dr. Orlene Plum Audrey Burke is an 83 y.o. female.  HPI: Audrey Burke is an 83 yo female who was in her home yesterday and slipped coming out of the bathroom , falling and landing on her right side with immediate pain and inability to get up. This was a mechanical fall and not associated with any prodromal symptoms. She did not hit her head or sustain an LOC with the fall. SHe lives at home and ambulates independently  Past Medical History:  Diagnosis Date  . Hypertension     History reviewed. No pertinent surgical history.  Family History  Problem Relation Age of Onset  . Diabetes Neg Hx   . Cancer Neg Hx   . Hypertension Neg Hx     Social History:  reports that she has never smoked. She has never used smokeless tobacco. She reports that she does not drink alcohol or use drugs.  Allergies: No Known Allergies  Medications: I have reviewed the patient's current medications.  Results for orders placed or performed during the hospital encounter of 09/18/16 (from the past 48 hour(s))  Basic metabolic panel     Status: Abnormal   Collection Time: 09/18/16  5:15 PM  Result Value Ref Range   Sodium 137 135 - 145 mmol/L   Potassium 4.2 3.5 - 5.1 mmol/L   Chloride 106 101 - 111 mmol/L   CO2 24 22 - 32 mmol/L   Glucose, Bld 131 (H) 65 - 99 mg/dL   BUN 18 6 - 20 mg/dL   Creatinine, Ser 0.99 0.44 - 1.00 mg/dL   Calcium 9.0 8.9 - 10.3 mg/dL   GFR calc non Af Amer 52 (L) >60 mL/min   GFR calc Af Amer 60 (L) >60 mL/min    Comment: (NOTE) The eGFR has been calculated using the CKD EPI equation. This calculation has not been validated in all clinical situations. eGFR's persistently <60 mL/min signify possible Chronic Kidney Disease.    Anion gap 7 5 - 15  CBC WITH DIFFERENTIAL     Status: Abnormal   Collection Time: 09/18/16  5:15 PM  Result Value Ref Range   WBC 13.5 (H) 4.0 - 10.5 K/uL   RBC 4.78 3.87 - 5.11 MIL/uL    Hemoglobin 13.3 12.0 - 15.0 g/dL   HCT 39.7 36.0 - 46.0 %   MCV 83.1 78.0 - 100.0 fL   MCH 27.8 26.0 - 34.0 pg   MCHC 33.5 30.0 - 36.0 g/dL   RDW 13.5 11.5 - 15.5 %   Platelets 158 150 - 400 K/uL   Neutrophils Relative % 83 %   Neutro Abs 11.1 (H) 1.7 - 7.7 K/uL   Lymphocytes Relative 13 %   Lymphs Abs 1.8 0.7 - 4.0 K/uL   Monocytes Relative 3 %   Monocytes Absolute 0.4 0.1 - 1.0 K/uL   Eosinophils Relative 1 %   Eosinophils Absolute 0.1 0.0 - 0.7 K/uL   Basophils Relative 0 %   Basophils Absolute 0.0 0.0 - 0.1 K/uL  Protime-INR     Status: Abnormal   Collection Time: 09/18/16  5:15 PM  Result Value Ref Range   Prothrombin Time 16.5 (H) 11.4 - 15.2 seconds   INR 1.32   Type and screen [place]     Status: None   Collection Time: 09/18/16  5:15 PM  Result Value Ref Range   ABO/RH(D) O POS    Antibody Screen NEG  Sample Expiration 09/21/2016   ABO/Rh     Status: None   Collection Time: 09/18/16  5:15 PM  Result Value Ref Range   ABO/RH(D) O POS   Urinalysis, Routine w reflex microscopic     Status: Abnormal   Collection Time: 09/18/16 11:25 PM  Result Value Ref Range   Color, Urine STRAW (A) YELLOW   APPearance CLEAR CLEAR   Specific Gravity, Urine 1.013 1.005 - 1.030   pH 8.0 5.0 - 8.0   Glucose, UA NEGATIVE NEGATIVE mg/dL   Hgb urine dipstick NEGATIVE NEGATIVE   Bilirubin Urine NEGATIVE NEGATIVE   Ketones, ur NEGATIVE NEGATIVE mg/dL   Protein, ur NEGATIVE NEGATIVE mg/dL   Nitrite NEGATIVE NEGATIVE   Leukocytes, UA NEGATIVE NEGATIVE   RBC / HPF 0-5 0 - 5 RBC/hpf   WBC, UA 0-5 0 - 5 WBC/hpf   Bacteria, UA NONE SEEN NONE SEEN   Squamous Epithelial / LPF NONE SEEN NONE SEEN   Mucous PRESENT   CBC     Status: Abnormal   Collection Time: 09/19/16  4:51 AM  Result Value Ref Range   WBC 10.9 (H) 4.0 - 10.5 K/uL   RBC 4.58 3.87 - 5.11 MIL/uL   Hemoglobin 12.7 12.0 - 15.0 g/dL   HCT 38.7 36.0 - 46.0 %   MCV 84.5 78.0 - 100.0 fL   MCH 27.7  26.0 - 34.0 pg   MCHC 32.8 30.0 - 36.0 g/dL   RDW 14.0 11.5 - 15.5 %   Platelets 149 (L) 150 - 400 K/uL  Basic metabolic panel     Status: Abnormal   Collection Time: 09/19/16  4:51 AM  Result Value Ref Range   Sodium 138 135 - 145 mmol/L   Potassium 4.1 3.5 - 5.1 mmol/L   Chloride 106 101 - 111 mmol/L   CO2 25 22 - 32 mmol/L   Glucose, Bld 136 (H) 65 - 99 mg/dL   BUN 15 6 - 20 mg/dL   Creatinine, Ser 0.69 0.44 - 1.00 mg/dL   Calcium 8.7 (L) 8.9 - 10.3 mg/dL   GFR calc non Af Amer >60 >60 mL/min   GFR calc Af Amer >60 >60 mL/min    Comment: (NOTE) The eGFR has been calculated using the CKD EPI equation. This calculation has not been validated in all clinical situations. eGFR's persistently <60 mL/min signify possible Chronic Kidney Disease.    Anion gap 7 5 - 15  Albumin     Status: None   Collection Time: 09/19/16  4:51 AM  Result Value Ref Range   Albumin 3.5 3.5 - 5.0 g/dL    Dg Knee 1-2 Views Right  Result Date: 09/18/2016 CLINICAL DATA:  RIGHT hip fracture. EXAM: RIGHT KNEE - 1-2 VIEW COMPARISON:  None FINDINGS: Two views of the RIGHT knee in nonstandard orientation. No clear evidence of fracture. There is narrowing of the medial compartment. No joint effusion. IMPRESSION: No gross evidence of RIGHT knee fracture. Electronically Signed   By: Suzy Bouchard M.D.   On: 09/18/2016 17:44   Ct Head Wo Contrast  Result Date: 09/18/2016 CLINICAL DATA:  Trip and fell onto wooden deck, no LOC. No head injury EXAM: CT HEAD WITHOUT CONTRAST TECHNIQUE: Contiguous axial images were obtained from the base of the skull through the vertex without intravenous contrast. COMPARISON:  Head CT 06/05/2011 FINDINGS: Brain: No acute intracranial hemorrhage. No focal mass lesion. No CT evidence of acute infarction. No midline shift or mass effect. No hydrocephalus. Basilar cisterns  are patent. Calcifications within LEFT RIGHT basal ganglia unchanged from comparison CT. Moderate periventricular  white matter hypodensity. Moderate atrophy. Head motion degrades skullbase evaluation. Vascular: No hyperdense vessel or unexpected calcification. Skull: Normal. Negative for fracture or focal lesion. Head motion does degrade skullbase evaluation Sinuses/Orbits: Paranasal sinuses and mastoid air cells are clear. Orbits are clear. Other: None. IMPRESSION: 1. No intracranial trauma. 2. Head motion degrades skullbase evaluation. 3. Atrophy and white matter microvascular disease. Electronically Signed   By: Suzy Bouchard M.D.   On: 09/18/2016 18:15   Dg Chest Port 1 View  Result Date: 09/18/2016 CLINICAL DATA:  Initial evaluation for preoperative evaluation. EXAM: PORTABLE CHEST 1 VIEW COMPARISON:  Prior radiograph from 06/20/2011. FINDINGS: Moderate cardiomegaly, stable from previous. Mediastinal silhouette within normal limits. Lungs mildly hypoinflated. Central perihilar vascular congestion without overt pulmonary edema. No pleural effusion. No focal infiltrates. No pneumothorax. No acute osseous abnormality. IMPRESSION: 1. Cardiomegaly with mild central perihilar vascular congestion without overt pulmonary edema. 2. No other active cardiopulmonary disease. Electronically Signed   By: Jeannine Boga M.D.   On: 09/18/2016 20:14   Dg Hip Unilat  With Pelvis 2-3 Views Right  Result Date: 09/18/2016 CLINICAL DATA:  Pt c/o severe RIGHT hip pain s/p tripping over step from laundry room into kitchen at home this evening, denies prior fx's this leg. EXAM: DG HIP (WITH OR WITHOUT PELVIS) 2-3V RIGHT COMPARISON:  None. FINDINGS: Intertrochanteric fracture of the proximal RIGHT femur with varus angulation. The femoral head is located. No pelvic fracture. IMPRESSION: Intertrochanteric fracture proximal RIGHT femur. Electronically Signed   By: Suzy Bouchard M.D.   On: 09/18/2016 17:40    ROS Blood pressure (!) 180/76, pulse 76, temperature 99 F (37.2 C), temperature source Oral, resp. rate 20, height 5'  3" (1.6 m), weight 86.9 kg (191 lb 9.3 oz), SpO2 98 %. Physical Exam Physical Examination: General appearance - alert, well appearing, and in no distress Mental status - alert, oriented to person, place, and time Chest - clear to auscultation, no wheezes, rales or rhonchi, symmetric air entry Heart - normal rate, regular rhythm, normal S1, S2, no murmurs, rubs, clicks or gallops Abdomen - soft, nontender, nondistended, no masses or organomegaly RLE shortened and rotated; able to wiggle toes and move foot, sensation and pulses intact; tender over lateral hip with pain on any attempted motion   Assessment/Plan: Right intertrochanteric femur fracture- Plan intramedullary fixation of this fracture to allow for pain relief and ambulation. Discussed in detail with patient who elects to proceed.  Gearlean Alf 09/19/2016, 6:48 AM

## 2016-09-19 NOTE — Transfer of Care (Signed)
Immediate Anesthesia Transfer of Care Note  Patient: Audrey Burke  Procedure(s) Performed: Procedure(s): INTRAMEDULLARY (IM) NAIL FEMORAL (Right)  Patient Location: PACU  Anesthesia Type:General  Level of Consciousness:  sedated, patient cooperative and responds to stimulation  Airway & Oxygen Therapy:Patient Spontanous Breathing and Patient connected to face mask oxgen  Post-op Assessment:  Report given to PACU RN and Post -op Vital signs reviewed and stable  Post vital signs:  Reviewed and stable  Last Vitals:  Vitals:   09/19/16 1359 09/19/16 1540  BP: (!) 190/71 (!) 172/61  Pulse: 81 78  Resp: (!) 22   Temp: 37.3 C     Complications: No apparent anesthesia complications

## 2016-09-19 NOTE — Progress Notes (Signed)
Pt placed in soft emergent restaiants per Dr Chilton SiGreen.  Pt combative and refusing to wear oxygen, pulling on IV lines.  Sats 85% on room air-93% with 4L. Pt given 1 mg dilaudid for pain.  No family in surgical waiting area or pt room.

## 2016-09-19 NOTE — Anesthesia Procedure Notes (Signed)
Procedure Name: Intubation Date/Time: 09/19/2016 5:08 PM Performed by: Montel Clock Pre-anesthesia Checklist: Patient identified, Emergency Drugs available, Suction available, Patient being monitored and Timeout performed Patient Re-evaluated:Patient Re-evaluated prior to inductionOxygen Delivery Method: Circle system utilized Preoxygenation: Pre-oxygenation with 100% oxygen Intubation Type: IV induction Ventilation: Mask ventilation without difficulty and Oral airway inserted - appropriate to patient size Laryngoscope Size: Mac and 3 Grade View: Grade II Tube type: Oral Tube size: 7.0 mm Number of attempts: 1 Airway Equipment and Method: Stylet Placement Confirmation: ETT inserted through vocal cords under direct vision,  positive ETCO2 and breath sounds checked- equal and bilateral Secured at: 21 cm Tube secured with: Tape Dental Injury: Teeth and Oropharynx as per pre-operative assessment

## 2016-09-19 NOTE — Consult Note (Signed)
Reason for Consult:Right femur fracture Referring Physician: Dr. Orlene Plum Audrey Burke is an 83 y.o. female.  HPI: Audrey Burke is an 83 yo female who was in her home yesterday and slipped coming out of the bathroom , falling and landing on her right side with immediate pain and inability to get up. This was a mechanical fall and not associated with any prodromal symptoms. She did not hit her head or sustain an LOC with the fall. SHe lives at home and ambulates independently  Past Medical History:  Diagnosis Date  . Hypertension     History reviewed. No pertinent surgical history.  Family History  Problem Relation Age of Onset  . Diabetes Neg Hx   . Cancer Neg Hx   . Hypertension Neg Hx     Social History:  reports that she has never smoked. She has never used smokeless tobacco. She reports that she does not drink alcohol or use drugs.  Allergies: No Known Allergies  Medications: I have reviewed the patient's current medications.  Results for orders placed or performed during the hospital encounter of 09/18/16 (from the past 48 hour(s))  Basic metabolic panel     Status: Abnormal   Collection Time: 09/18/16  5:15 PM  Result Value Ref Range   Sodium 137 135 - 145 mmol/L   Potassium 4.2 3.5 - 5.1 mmol/L   Chloride 106 101 - 111 mmol/L   CO2 24 22 - 32 mmol/L   Glucose, Bld 131 (H) 65 - 99 mg/dL   BUN 18 6 - 20 mg/dL   Creatinine, Ser 0.99 0.44 - 1.00 mg/dL   Calcium 9.0 8.9 - 10.3 mg/dL   GFR calc non Af Amer 52 (L) >60 mL/min   GFR calc Af Amer 60 (L) >60 mL/min    Comment: (NOTE) The eGFR has been calculated using the CKD EPI equation. This calculation has not been validated in all clinical situations. eGFR's persistently <60 mL/min signify possible Chronic Kidney Disease.    Anion gap 7 5 - 15  CBC WITH DIFFERENTIAL     Status: Abnormal   Collection Time: 09/18/16  5:15 PM  Result Value Ref Range   WBC 13.5 (H) 4.0 - 10.5 K/uL   RBC 4.78 3.87 - 5.11 MIL/uL    Hemoglobin 13.3 12.0 - 15.0 g/dL   HCT 39.7 36.0 - 46.0 %   MCV 83.1 78.0 - 100.0 fL   MCH 27.8 26.0 - 34.0 pg   MCHC 33.5 30.0 - 36.0 g/dL   RDW 13.5 11.5 - 15.5 %   Platelets 158 150 - 400 K/uL   Neutrophils Relative % 83 %   Neutro Abs 11.1 (H) 1.7 - 7.7 K/uL   Lymphocytes Relative 13 %   Lymphs Abs 1.8 0.7 - 4.0 K/uL   Monocytes Relative 3 %   Monocytes Absolute 0.4 0.1 - 1.0 K/uL   Eosinophils Relative 1 %   Eosinophils Absolute 0.1 0.0 - 0.7 K/uL   Basophils Relative 0 %   Basophils Absolute 0.0 0.0 - 0.1 K/uL  Protime-INR     Status: Abnormal   Collection Time: 09/18/16  5:15 PM  Result Value Ref Range   Prothrombin Time 16.5 (H) 11.4 - 15.2 seconds   INR 1.32   Type and screen [place]     Status: None   Collection Time: 09/18/16  5:15 PM  Result Value Ref Range   ABO/RH(D) O POS    Antibody Screen NEG  Sample Expiration 09/21/2016   ABO/Rh     Status: None   Collection Time: 09/18/16  5:15 PM  Result Value Ref Range   ABO/RH(D) O POS   Urinalysis, Routine w reflex microscopic     Status: Abnormal   Collection Time: 09/18/16 11:25 PM  Result Value Ref Range   Color, Urine STRAW (A) YELLOW   APPearance CLEAR CLEAR   Specific Gravity, Urine 1.013 1.005 - 1.030   pH 8.0 5.0 - 8.0   Glucose, UA NEGATIVE NEGATIVE mg/dL   Hgb urine dipstick NEGATIVE NEGATIVE   Bilirubin Urine NEGATIVE NEGATIVE   Ketones, ur NEGATIVE NEGATIVE mg/dL   Protein, ur NEGATIVE NEGATIVE mg/dL   Nitrite NEGATIVE NEGATIVE   Leukocytes, UA NEGATIVE NEGATIVE   RBC / HPF 0-5 0 - 5 RBC/hpf   WBC, UA 0-5 0 - 5 WBC/hpf   Bacteria, UA NONE SEEN NONE SEEN   Squamous Epithelial / LPF NONE SEEN NONE SEEN   Mucous PRESENT   CBC     Status: Abnormal   Collection Time: 09/19/16  4:51 AM  Result Value Ref Range   WBC 10.9 (H) 4.0 - 10.5 K/uL   RBC 4.58 3.87 - 5.11 MIL/uL   Hemoglobin 12.7 12.0 - 15.0 g/dL   HCT 38.7 36.0 - 46.0 %   MCV 84.5 78.0 - 100.0 fL   MCH 27.7  26.0 - 34.0 pg   MCHC 32.8 30.0 - 36.0 g/dL   RDW 14.0 11.5 - 15.5 %   Platelets 149 (L) 150 - 400 K/uL  Basic metabolic panel     Status: Abnormal   Collection Time: 09/19/16  4:51 AM  Result Value Ref Range   Sodium 138 135 - 145 mmol/L   Potassium 4.1 3.5 - 5.1 mmol/L   Chloride 106 101 - 111 mmol/L   CO2 25 22 - 32 mmol/L   Glucose, Bld 136 (H) 65 - 99 mg/dL   BUN 15 6 - 20 mg/dL   Creatinine, Ser 0.69 0.44 - 1.00 mg/dL   Calcium 8.7 (L) 8.9 - 10.3 mg/dL   GFR calc non Af Amer >60 >60 mL/min   GFR calc Af Amer >60 >60 mL/min    Comment: (NOTE) The eGFR has been calculated using the CKD EPI equation. This calculation has not been validated in all clinical situations. eGFR's persistently <60 mL/min signify possible Chronic Kidney Disease.    Anion gap 7 5 - 15  Albumin     Status: None   Collection Time: 09/19/16  4:51 AM  Result Value Ref Range   Albumin 3.5 3.5 - 5.0 g/dL    Dg Knee 1-2 Views Right  Result Date: 09/18/2016 CLINICAL DATA:  RIGHT hip fracture. EXAM: RIGHT KNEE - 1-2 VIEW COMPARISON:  None FINDINGS: Two views of the RIGHT knee in nonstandard orientation. No clear evidence of fracture. There is narrowing of the medial compartment. No joint effusion. IMPRESSION: No gross evidence of RIGHT knee fracture. Electronically Signed   By: Suzy Bouchard M.D.   On: 09/18/2016 17:44   Ct Head Wo Contrast  Result Date: 09/18/2016 CLINICAL DATA:  Trip and fell onto wooden deck, no LOC. No head injury EXAM: CT HEAD WITHOUT CONTRAST TECHNIQUE: Contiguous axial images were obtained from the base of the skull through the vertex without intravenous contrast. COMPARISON:  Head CT 06/05/2011 FINDINGS: Brain: No acute intracranial hemorrhage. No focal mass lesion. No CT evidence of acute infarction. No midline shift or mass effect. No hydrocephalus. Basilar cisterns  are patent. Calcifications within LEFT RIGHT basal ganglia unchanged from comparison CT. Moderate periventricular  white matter hypodensity. Moderate atrophy. Head motion degrades skullbase evaluation. Vascular: No hyperdense vessel or unexpected calcification. Skull: Normal. Negative for fracture or focal lesion. Head motion does degrade skullbase evaluation Sinuses/Orbits: Paranasal sinuses and mastoid air cells are clear. Orbits are clear. Other: None. IMPRESSION: 1. No intracranial trauma. 2. Head motion degrades skullbase evaluation. 3. Atrophy and white matter microvascular disease. Electronically Signed   By: Suzy Bouchard M.D.   On: 09/18/2016 18:15   Dg Chest Port 1 View  Result Date: 09/18/2016 CLINICAL DATA:  Initial evaluation for preoperative evaluation. EXAM: PORTABLE CHEST 1 VIEW COMPARISON:  Prior radiograph from 06/20/2011. FINDINGS: Moderate cardiomegaly, stable from previous. Mediastinal silhouette within normal limits. Lungs mildly hypoinflated. Central perihilar vascular congestion without overt pulmonary edema. No pleural effusion. No focal infiltrates. No pneumothorax. No acute osseous abnormality. IMPRESSION: 1. Cardiomegaly with mild central perihilar vascular congestion without overt pulmonary edema. 2. No other active cardiopulmonary disease. Electronically Signed   By: Jeannine Boga M.D.   On: 09/18/2016 20:14   Dg Hip Unilat  With Pelvis 2-3 Views Right  Result Date: 09/18/2016 CLINICAL DATA:  Pt c/o severe RIGHT hip pain s/p tripping over step from laundry room into kitchen at home this evening, denies prior fx's this leg. EXAM: DG HIP (WITH OR WITHOUT PELVIS) 2-3V RIGHT COMPARISON:  None. FINDINGS: Intertrochanteric fracture of the proximal RIGHT femur with varus angulation. The femoral head is located. No pelvic fracture. IMPRESSION: Intertrochanteric fracture proximal RIGHT femur. Electronically Signed   By: Suzy Bouchard M.D.   On: 09/18/2016 17:40    ROS Blood pressure (!) 180/76, pulse 76, temperature 99 F (37.2 C), temperature source Oral, resp. rate 20, height 5'  3" (1.6 m), weight 86.9 kg (191 lb 9.3 oz), SpO2 98 %. Physical Exam Physical Examination: General appearance - alert, well appearing, and in no distress Mental status - alert, oriented to person, place, and time Chest - clear to auscultation, no wheezes, rales or rhonchi, symmetric air entry Heart - normal rate, regular rhythm, normal S1, S2, no murmurs, rubs, clicks or gallops Abdomen - soft, nontender, nondistended, no masses or organomegaly RLE shortened and rotated; able to wiggle toes and move foot, sensation and pulses intact; tender over lateral hip with pain on any attempted motion   Assessment/Plan: Right intertrochanteric femur fracture- Plan intramedullary fixation of this fracture to allow for pain relief and ambulation. Discussed in detail with patient who elects to proceed.  Audrey Burke 09/19/2016, 6:48 AM

## 2016-09-19 NOTE — Op Note (Signed)
  OPERATIVE REPORT   PREOPERATIVE DIAGNOSIS: Right intertrochanteric femur fracture.   POSTOP DIAGNOSIS: Right intertrochanteric femur fracture.   PROCEDURE: Intramedullary nailing, Right intertrochanteric femur  fracture.   SURGEON: Ollen GrossFrank Prestyn Mahn, M.D.   ASSISTANT: Avel Peacerew Perkins, PA-C  ANESTHESIA:General  Estimated BLOOD LOSS: 100 ml  DRAINS: None.   COMPLICATIONS:   None  CONDITION: -PACU - hemodynamically stable.    CLINICAL NOTE: Audrey Burke is an 83 y.o. female, who had a fall yesterday  sustaining a displaced  Right intertrochanteric femur fracture. They have been cleared medically and present for operative fixation   PROCEDURE IN DETAIL: After successful administration of  General,  the patient was placed on the fracture table with Right lower extremity in a well-padded traction boot,  Leftlower extremity in a well-padded leg holder. Under fluoroscopic guidance, the fracture was reduced. The traction was locked in this position. Thigh was prepped  and draped in the usual sterile fashion. The guide pin for the [employer]  Affixus was then passed percutaneously to the tip of the greater  trochanter, and then entered into the femoral canal. It was passed into the  canal. The small incision was made and the starter reamer passed over  the guide pin. This was then removed. The nail which was an 11  mm  diameter short trochanteric nail with 130 degrees angle was attached to  the external guide and then passed into the femoral canal, impacted to  the appropriate depth in the canal, then we used the external guide to  place the lag screw. Through the external guide, a guide pin was  passed. Small incision made, and the guide pin was in the center of the  femoral head on the AP and slightly center to posterior on the lateral.  Length was 95 mm. Triple reamer was passed over the guide pin. 95 mm  lag screw was placed. It was then locked down with a locking screw.  Through the  external guide, the distal interlock was placed through the  static hole and this was 38 mm in length with excellent bicortical  purchase. The external guide was then removed. Hardware was in good  position and fracture was well reduced. Wound was copiously irrigated with saline  solution, and  closed deep with interrupted 1 Vicryl, subcu  interrupted 2-0 Vicryl, subcuticular running 4-0 Monocryl. Incision was  cleaned and dried and sterile dressings applied. The patient was awakened and  transported to recovery in stable condition.   Gus RankinFrank V. Modest Draeger, MD    09/19/2016, 6:16 PM

## 2016-09-19 NOTE — Progress Notes (Signed)
PROGRESS NOTE    Audrey Burke  ZOX:[id number]  DOB: January 28, 1932  DOA: 09/18/2016 PCP: Georgann HousekeeperHUSAIN,KARRAR, MD Outpatient Specialists:   Hospital course: Presented with right hip pain after fall. Patient reports that she tripped while trying to do laundry falling onto a wooden deck, did not lose consciousness, did not hit her head no headache. She was unable to stand called EMS on arrival she was given fentanyl and improved in pain. Denies chest pain or shortness of breath with ambulation.   Assessment & Plan:   1. Closed right hip fracture - appreciate orthopedics assistance, hip fracture protocol.  NPO.  Holding Xarelto.  2. Leukocytosis - WBC trending down this morning.  No evidence of infection found. 3. Paroxysmal atrial fibrillation-Holding Xarelto.  Monitor rhythm.  4. History of CHF - Holding lasix for now.  Resume later.   5. Prolonged QTc - follow closely, correcting electrolytes, avoid QTc prolonging medications.  6. Essential Hypertension - added hydralazine as needed.    DVT prophylaxis: SCDs Code Status: full Family Communication: bedside Disposition Plan: TBD  Consultants:  orthopedics  Procedures:  pending  Subjective: Pt stable overnight.   Objective: Vitals:   09/19/16 0207 09/19/16 0425 09/19/16 0627 09/19/16 0828  BP: (!) 185/65 (!) 185/73 (!) 180/76 (!) 176/74  Pulse: 85 78 76 70  Resp: (!) 24 (!) 21 20   Temp: 98.3 F (36.8 C) 99.2 F (37.3 C) 99 F (37.2 C)   TempSrc: Oral Oral Oral   SpO2: 97% 96% 98%   Weight:      Height:        Intake/Output Summary (Last 24 hours) at 09/19/16 0947 Last data filed at 09/19/16 0600  Gross per 24 hour  Intake           551.25 ml  Output              900 ml  Net          -348.75 ml   Filed Weights   09/18/16 2212  Weight: 86.9 kg (191 lb 9.3 oz)    Exam:  General exam: awake, alert, NAD. Cooperative.  Respiratory system: Clear. No increased work of breathing. Cardiovascular system: S1 & S2  heard. No JVD, murmurs, gallops, clicks or pedal edema. Gastrointestinal system: Abdomen is nondistended, soft and nontender. Normal bowel sounds heard. Central nervous system: Alert and oriented. No focal neurological deficits. Extremities: no cyanosis.  Data Reviewed: Basic Metabolic Panel:  Recent Labs Lab 09/18/16 1715 09/19/16 0451  NA 137 138  K 4.2 4.1  CL 106 106  CO2 24 25  GLUCOSE 131* 136*  BUN 18 15  CREATININE 0.99 0.69  CALCIUM 9.0 8.7*   Liver Function Tests:  Recent Labs Lab 09/19/16 0451  ALBUMIN 3.5   No results for input(s): LIPASE, AMYLASE in the last 168 hours. No results for input(s): AMMONIA in the last 168 hours. CBC:  Recent Labs Lab 09/18/16 1715 09/19/16 0451  WBC 13.5* 10.9*  NEUTROABS 11.1*  --   HGB 13.3 12.7  HCT 39.7 38.7  MCV 83.1 84.5  PLT 158 149*   Cardiac Enzymes: No results for input(s): CKTOTAL, CKMB, CKMBINDEX, TROPONINI in the last 168 hours. CBG (last 3)  No results for input(s): GLUCAP in the last 72 hours. No results found for this or any previous visit (from the past 240 hour(s)).   Studies: Dg Knee 1-2 Views Right  Result Date: 09/18/2016 CLINICAL DATA:  RIGHT hip fracture. EXAM: RIGHT KNEE - 1-2 VIEW  COMPARISON:  None FINDINGS: Two views of the RIGHT knee in nonstandard orientation. No clear evidence of fracture. There is narrowing of the medial compartment. No joint effusion. IMPRESSION: No gross evidence of RIGHT knee fracture. Electronically Signed   By: Genevive Bi M.D.   On: 09/18/2016 17:44   Ct Head Wo Contrast  Result Date: 09/18/2016 CLINICAL DATA:  Trip and fell onto wooden deck, no LOC. No head injury EXAM: CT HEAD WITHOUT CONTRAST TECHNIQUE: Contiguous axial images were obtained from the base of the skull through the vertex without intravenous contrast. COMPARISON:  Head CT 06/05/2011 FINDINGS: Brain: No acute intracranial hemorrhage. No focal mass lesion. No CT evidence of acute infarction. No  midline shift or mass effect. No hydrocephalus. Basilar cisterns are patent. Calcifications within LEFT RIGHT basal ganglia unchanged from comparison CT. Moderate periventricular white matter hypodensity. Moderate atrophy. Head motion degrades skullbase evaluation. Vascular: No hyperdense vessel or unexpected calcification. Skull: Normal. Negative for fracture or focal lesion. Head motion does degrade skullbase evaluation Sinuses/Orbits: Paranasal sinuses and mastoid air cells are clear. Orbits are clear. Other: None. IMPRESSION: 1. No intracranial trauma. 2. Head motion degrades skullbase evaluation. 3. Atrophy and white matter microvascular disease. Electronically Signed   By: Genevive Bi M.D.   On: 09/18/2016 18:15   Dg Chest Port 1 View  Result Date: 09/18/2016 CLINICAL DATA:  Initial evaluation for preoperative evaluation. EXAM: PORTABLE CHEST 1 VIEW COMPARISON:  Prior radiograph from 06/20/2011. FINDINGS: Moderate cardiomegaly, stable from previous. Mediastinal silhouette within normal limits. Lungs mildly hypoinflated. Central perihilar vascular congestion without overt pulmonary edema. No pleural effusion. No focal infiltrates. No pneumothorax. No acute osseous abnormality. IMPRESSION: 1. Cardiomegaly with mild central perihilar vascular congestion without overt pulmonary edema. 2. No other active cardiopulmonary disease. Electronically Signed   By: Rise Mu M.D.   On: 09/18/2016 20:14   Dg Hip Unilat  With Pelvis 2-3 Views Right  Result Date: 09/18/2016 CLINICAL DATA:  Pt c/o severe RIGHT hip pain s/p tripping over step from laundry room into kitchen at home this evening, denies prior fx's this leg. EXAM: DG HIP (WITH OR WITHOUT PELVIS) 2-3V RIGHT COMPARISON:  None. FINDINGS: Intertrochanteric fracture of the proximal RIGHT femur with varus angulation. The femoral head is located. No pelvic fracture. IMPRESSION: Intertrochanteric fracture proximal RIGHT femur. Electronically Signed    By: Genevive Bi M.D.   On: 09/18/2016 17:40     Scheduled Meds: . amLODipine  5 mg Oral Daily  . chlorhexidine  60 mL Topical Once  . famotidine  20 mg Oral Daily  . metoprolol succinate  50 mg Oral Daily  . povidone-iodine  2 application Topical Once   Continuous Infusions: . sodium chloride 75 mL/hr at 09/18/16 2239    Active Problems:   Closed right hip fracture (HCC)   Leukocytosis   Hip fracture (HCC)   Paroxysmal a-fib (HCC)   CHF (congestive heart failure), NYHA class I (HCC)   Prolonged QT interval   Time spent:   Standley Dakins, MD, FAAFP [facility] Pager [phone]  If 7PM-7AM, please contact night-coverage [email or web address] Password [facility] 09/19/2016, 9:47 AM    LOS: 1 day

## 2016-09-19 NOTE — Progress Notes (Signed)
  Echocardiogram 2D Echocardiogram has been performed.  Arvil ChacoFoster, Ellamarie Naeve 09/19/2016, 12:45 PM

## 2016-09-19 NOTE — Anesthesia Preprocedure Evaluation (Signed)
Anesthesia Evaluation  Patient identified by MRN, date of birth, ID band Patient awake    Reviewed: Allergy & Precautions, NPO status , Patient's Chart, lab work & pertinent test results  Airway Mallampati: II  TM Distance: >3 FB     Dental   Pulmonary neg pulmonary ROS,    breath sounds clear to auscultation       Cardiovascular hypertension, +CHF   Rhythm:Regular Rate:Normal     Neuro/Psych    GI/Hepatic negative GI ROS, Neg liver ROS,   Endo/Other  negative endocrine ROS  Renal/GU negative Renal ROS     Musculoskeletal   Abdominal   Peds  Hematology   Anesthesia Other Findings   Reproductive/Obstetrics                             Anesthesia Physical Anesthesia Plan  ASA: III  Anesthesia Plan: General   Post-op Pain Management:    Induction: Intravenous  Airway Management Planned: Oral ETT  Additional Equipment:   Intra-op Plan:   Post-operative Plan: Possible Post-op intubation/ventilation  Informed Consent: I have reviewed the patients History and Physical, chart, labs and discussed the procedure including the risks, benefits and alternatives for the proposed anesthesia with the patient or authorized representative who has indicated his/her understanding and acceptance.   Dental advisory given  Plan Discussed with: CRNA and Anesthesiologist  Anesthesia Plan Comments:         Anesthesia Quick Evaluation

## 2016-09-19 NOTE — Progress Notes (Signed)
Dr Lequita HaltAluisio and Dr Chilton SiGreen aware of soft restraints needed in PACU. L Epperson AC contacted for procedure for sitter on floor.  RN was advised sitters not available but floor RN can order and arrange a tele sitter.  All fingers are pink with good movement.

## 2016-09-19 NOTE — Interval H&P Note (Signed)
History and Physical Interval Note:  09/19/2016 4:10 PM  Audrey Burke  has presented today for surgery, with the diagnosis of right femur fracture  The various methods of treatment have been discussed with the patient and family. After consideration of risks, benefits and other options for treatment, the patient has consented to  Procedure(s): INTRAMEDULLARY (IM) NAIL FEMORAL (Right) as a surgical intervention .  The patient's history has been reviewed, patient examined, no change in status, stable for surgery.  I have reviewed the patient's chart and labs.  Questions were answered to the patient's satisfaction.     Loanne DrillingALUISIO,[name]

## 2016-09-20 ENCOUNTER — Encounter (HOSPITAL_COMMUNITY): Payer: Self-pay | Admitting: Orthopedic Surgery

## 2016-09-20 LAB — BASIC METABOLIC PANEL
ANION GAP: 10 (ref 5–15)
BUN: 13 mg/dL (ref 6–20)
CO2: 21 mmol/L — ABNORMAL LOW (ref 22–32)
Calcium: 8.8 mg/dL — ABNORMAL LOW (ref 8.9–10.3)
Chloride: 104 mmol/L (ref 101–111)
Creatinine, Ser: 0.65 mg/dL (ref 0.44–1.00)
GFR calc Af Amer: >60 mL/min (ref >60)
Glucose, Bld: 185 mg/dL — ABNORMAL HIGH (ref 65–99)
POTASSIUM: 4.1 mmol/L (ref 3.5–5.1)
SODIUM: 135 mmol/L (ref 135–145)

## 2016-09-20 LAB — CBC
HCT: 35.9 % — ABNORMAL LOW (ref 36.0–46.0)
Hemoglobin: 11.8 g/dL — ABNORMAL LOW (ref 12.0–15.0)
MCH: 27.8 pg (ref 26.0–34.0)
MCHC: 32.9 g/dL (ref 30.0–36.0)
MCV: 84.5 fL (ref 78.0–100.0)
PLATELETS: 157 10*3/uL (ref 150–400)
RBC: 4.25 MIL/uL (ref 3.87–5.11)
RDW: 14.3 % (ref 11.5–15.5)
WBC: 11.8 10*3/uL — AB (ref 4.0–10.5)

## 2016-09-20 LAB — WET PREP, GENITAL
CLUE CELLS WET PREP: NONE SEEN
SPERM: NONE SEEN
TRICH WET PREP: NONE SEEN
YEAST WET PREP: NONE SEEN

## 2016-09-20 LAB — VITAMIN D 25 HYDROXY (VIT D DEFICIENCY, FRACTURES): VIT D 25 HYDROXY: 32.9 ng/mL (ref 30.0–100.0)

## 2016-09-20 MED ORDER — HYDRALAZINE HCL 20 MG/ML IJ SOLN
15.0000 mg | INTRAMUSCULAR | Status: DC | PRN
Start: 2016-09-20 — End: 2016-09-22
  Filled 2016-09-20 (×2): qty 1

## 2016-09-20 NOTE — Progress Notes (Signed)
OT Cancellation Note  Patient Details Name: Pearletha Furlvangilia Livingston MRN: [id number] DOB: [birth year]   Cancelled Treatment:    Reason Eval/Treat Not Completed: Other (comment).  Spoke to PT and they are recommending SNF.  Will defer OT to that venue.  If plan changes, we will see pt.  Thanks.  Shermar Friedland 09/20/2016, 2:35 PM  Marica OtterMaryellen Chrisie Jankovich, OTR/L [phone] 09/20/2016

## 2016-09-20 NOTE — Progress Notes (Addendum)
PROGRESS NOTE    Audrey Burke  ZOX:[id number]  DOB: [birth year]  DOA: 09/18/2016 PCP: Georgann Housekeeper, MD  Hospital course: Presented with right hip pain after fall. Patient reports that she tripped while trying to do laundry falling onto a wooden deck, did not lose consciousness, did not hit her head no headache. She was unable to stand called EMS on arrival she was given fentanyl and improved in pain. Denies chest pain or shortness of breath with ambulation.   Assessment & Plan:   1. Closed right hip fracture - POD#1 s/p repair, appreciate orthopedics assistance.  Holding Xarelto.  2. Leukocytosis - WBC trending down and now going back up after surgery, likely reactive from surgery and also received decadron IV injection 1/22.  No evidence of infection found. 3. Paroxysmal atrial fibrillation-Holding Xarelto for now.  Monitor rhythm.  4. Chronic Diastolic CHF - No acute CHF.  Holding lasix for now.  Could Resume 1/24 if eating and drinking better.    5. Acute delirium - safety restraints and monitoring had to be used, avoid haldol due to prolonged QTc. 6. Prolonged QTc - follow closely, correcting electrolytes, avoid QTc prolonging medications.  7. Essential Hypertension - added hydralazine as needed.  Continue amlodipine.  DVT prophylaxis: SCDs Code Status: full Family Communication: bedside Disposition Plan: TBD  Consultants:  orthopedics  Procedures: Right Intramedullary nailing, Right intertrochanteric femur fracture 1/22   Subjective: Pt has been confused and delirious overnight.   Objective: Vitals:   09/19/16 2014 09/20/16 0159 09/20/16 0501 09/20/16 0710  BP: (!) 182/88 (!) 162/81 (!) 173/80 (!) 165/64  Pulse: 91 (!) 112 (!) 106 (!) 108  Resp: 18 (!) 24 (!) 24   Temp: 98.2 F (36.8 C) 98.9 F (37.2 C) 98.4 F (36.9 C)   TempSrc: Axillary Oral Oral   SpO2: 92% 96% 95%   Weight:      Height:        Intake/Output Summary (Last 24 hours) at 09/20/16  0901 Last data filed at 09/20/16 0700  Gross per 24 hour  Intake             3780 ml  Output             2275 ml  Net             1505 ml   Filed Weights   09/18/16 2212  Weight: 86.9 kg (191 lb 9.3 oz)    Exam:  General exam: awake, alert, NAD. Cooperative. Confused. Respiratory system: Clear. No increased work of breathing. Cardiovascular system: S1 & S2 heard. No JVD, murmurs, gallops, clicks or pedal edema. Gastrointestinal system: Abdomen is nondistended, soft and nontender. Normal bowel sounds heard. Central nervous system: Alert and oriented. No focal neurological deficits. Extremities: wound c/d/i with good pulses bilateral LEs.  Data Reviewed: Basic Metabolic Panel:  Recent Labs Lab 09/18/16 1715 09/19/16 0451 09/20/16 0536  NA 137 138 135  K 4.2 4.1 4.1  CL 106 106 104  CO2 24 25 21*  GLUCOSE 131* 136* 185*  BUN 18 15 13   CREATININE 0.99 0.69 0.65  CALCIUM 9.0 8.7* 8.8*   Liver Function Tests:  Recent Labs Lab 09/19/16 0451  ALBUMIN 3.5   No results for input(s): LIPASE, AMYLASE in the last 168 hours. No results for input(s): AMMONIA in the last 168 hours. CBC:  Recent Labs Lab 09/18/16 1715 09/19/16 0451 09/20/16 0536  WBC 13.5* 10.9* 11.8*  NEUTROABS 11.1*  --   --   HGB 13.3  12.7 11.8*  HCT 39.7 38.7 35.9*  MCV 83.1 84.5 84.5  PLT 158 149* 157   Cardiac Enzymes: No results for input(s): CKTOTAL, CKMB, CKMBINDEX, TROPONINI in the last 168 hours. CBG (last 3)  No results for input(s): GLUCAP in the last 72 hours. Recent Results (from the past 240 hour(s))  Surgical pcr screen     Status: Abnormal   Collection Time: 09/19/16  1:37 PM  Result Value Ref Range Status   MRSA, PCR NEGATIVE NEGATIVE Final   Staphylococcus aureus POSITIVE (A) NEGATIVE Final    Comment:        The Xpert SA Assay (FDA approved for NASAL specimens in patients over 13 years of age), is one component of a comprehensive surveillance program.  Test performance  has been validated by [facility] for patients greater than or equal to 81 year old. It is not intended to diagnose infection nor to guide or monitor treatment.     Studies: Dg Knee 1-2 Views Right  Result Date: 09/18/2016 CLINICAL DATA:  RIGHT hip fracture. EXAM: RIGHT KNEE - 1-2 VIEW COMPARISON:  None FINDINGS: Two views of the RIGHT knee in nonstandard orientation. No clear evidence of fracture. There is narrowing of the medial compartment. No joint effusion. IMPRESSION: No gross evidence of RIGHT knee fracture. Electronically Signed   By: Genevive Bi M.D.   On: 09/18/2016 17:44   Ct Head Wo Contrast  Result Date: 09/18/2016 CLINICAL DATA:  Trip and fell onto wooden deck, no LOC. No head injury EXAM: CT HEAD WITHOUT CONTRAST TECHNIQUE: Contiguous axial images were obtained from the base of the skull through the vertex without intravenous contrast. COMPARISON:  Head CT 06/05/2011 FINDINGS: Brain: No acute intracranial hemorrhage. No focal mass lesion. No CT evidence of acute infarction. No midline shift or mass effect. No hydrocephalus. Basilar cisterns are patent. Calcifications within LEFT RIGHT basal ganglia unchanged from comparison CT. Moderate periventricular white matter hypodensity. Moderate atrophy. Head motion degrades skullbase evaluation. Vascular: No hyperdense vessel or unexpected calcification. Skull: Normal. Negative for fracture or focal lesion. Head motion does degrade skullbase evaluation Sinuses/Orbits: Paranasal sinuses and mastoid air cells are clear. Orbits are clear. Other: None. IMPRESSION: 1. No intracranial trauma. 2. Head motion degrades skullbase evaluation. 3. Atrophy and white matter microvascular disease. Electronically Signed   By: Genevive Bi M.D.   On: 09/18/2016 18:15   Dg Chest Port 1 View  Result Date: 09/18/2016 CLINICAL DATA:  Initial evaluation for preoperative evaluation. EXAM: PORTABLE CHEST 1 VIEW COMPARISON:  Prior radiograph from  06/20/2011. FINDINGS: Moderate cardiomegaly, stable from previous. Mediastinal silhouette within normal limits. Lungs mildly hypoinflated. Central perihilar vascular congestion without overt pulmonary edema. No pleural effusion. No focal infiltrates. No pneumothorax. No acute osseous abnormality. IMPRESSION: 1. Cardiomegaly with mild central perihilar vascular congestion without overt pulmonary edema. 2. No other active cardiopulmonary disease. Electronically Signed   By: Rise Mu M.D.   On: 09/18/2016 20:14   Dg C-arm 1-60 Min-no Report  Result Date: 09/19/2016 There is no Radiologist interpretation  for this exam.  Dg Hip Unilat  With Pelvis 2-3 Views Right  Result Date: 09/18/2016 CLINICAL DATA:  Pt c/o severe RIGHT hip pain s/p tripping over step from laundry room into kitchen at home this evening, denies prior fx's this leg. EXAM: DG HIP (WITH OR WITHOUT PELVIS) 2-3V RIGHT COMPARISON:  None. FINDINGS: Intertrochanteric fracture of the proximal RIGHT femur with varus angulation. The femoral head is located. No pelvic fracture. IMPRESSION: Intertrochanteric fracture  proximal RIGHT femur. Electronically Signed   By: Genevive BiStewart  Edmunds M.D.   On: 09/18/2016 17:40   Dg Femur, Min 2 Views Right  Result Date: 09/19/2016 CLINICAL DATA:  Status post internal fixation of right femoral fracture. Initial encounter. EXAM: RIGHT FEMUR 2 VIEWS COMPARISON:  Right hip radiographs performed 09/18/2016 FINDINGS: Three fluoroscopic C-arm images are provided from the OR, demonstrating placement of a dynamic hip screw at the right femur, transfixing the intertrochanteric fracture in grossly anatomic alignment. No new fractures are seen. The right femoral head remains seated at the acetabulum. IMPRESSION: Status post internal fixation of right femoral fracture in grossly anatomic alignment. Electronically Signed   By: Roanna RaiderJeffery  Chang M.D.   On: 09/19/2016 18:44   Scheduled Meds: . amLODipine  5 mg Oral Daily    . enoxaparin (LOVENOX) injection  40 mg Subcutaneous Q24H  . famotidine  20 mg Oral Daily  . metoprolol succinate  50 mg Oral Daily   Continuous Infusions: . sodium chloride 75 mL/hr at 09/19/16 2257   Active Problems:   Closed right hip fracture (HCC)   Leukocytosis   Hip fracture (HCC)   Paroxysmal a-fib (HCC)   CHF (congestive heart failure), NYHA class I (HCC)   Prolonged QT interval  Time spent:   Standley Dakinslanford Samera Macy, MD, FAAFP [facility] Pager [phone]  If 7PM-7AM, please contact night-coverage [email or web address] Password [email or web address] 09/20/2016, 9:01 AM    LOS: 2 days

## 2016-09-20 NOTE — Evaluation (Signed)
Physical Therapy Evaluation Patient Details Name: Audrey Burke MRN: [id number] DOB: December 25, 1931 Today's Date: 09/20/2016   History of Present Illness  Audrey Burke is an 83 y.o. female, who had a fall1/21/18, sustained Right intertrochanteric femur fracture. S/P IM nail 09/19/16 yesterday  Clinical Impression  The patient actually mobilized  Well with 2 assist. Able to bear some weight on the right leg. Appears some language difficulty with English. No family present. Pt admitted with above diagnosis. Pt currently with functional limitations due to the deficits listed below (see PT Problem List). Pt will benefit from skilled PT to increase their independence and safety with mobility to allow discharge to the venue listed below.       Follow Up Recommendations SNF;Supervision/Assistance - 24 hour    Equipment Recommendations  None recommended by PT    Recommendations for Other Services       Precautions / Restrictions Precautions Precautions: Fall Restrictions Weight Bearing Restrictions: No RLE Weight Bearing: Weight bearing as tolerated      Mobility  Bed Mobility Overal bed mobility: Needs Assistance Bed Mobility: Supine to Sit     Supine to sit: Mod assist;HOB elevated     General bed mobility comments: the patient moved the right lig to bed edge. Assist with the trunk to sit up.  Transfers Overall transfer level: Needs assistance Equipment used: Rolling walker (2 wheeled) Transfers: Sit to/from [employer] Sit to Stand: Mod assist;+2 physical assistance;+2 safety/equipment Stand pivot transfers: Mod assist;+2 physical assistance;+2 safety/equipment       General transfer comment: Initially decreased weight on the right leg, Improved with more steps as she pivoted to recliner. Recliner brought up as patient was not understanding backing up to recliner. Mulrimodal cues for hand and right leg position  Ambulation/Gait                 Stairs            Wheelchair Mobility    Modified Rankin (Stroke Patients Only)       Balance Overall balance assessment: Needs assistance;History of Falls Sitting-balance support: Feet supported;Bilateral upper extremity supported Sitting balance-Leahy Scale: Fair     Standing balance support: During functional activity;Bilateral upper extremity supported Standing balance-Leahy Scale: Poor                               Pertinent Vitals/Pain Pain Assessment: Faces Faces Pain Scale: Hurts a little bit Pain Location: right hip Pain Descriptors / Indicators: Grimacing Pain Intervention(s): Premedicated before session;Ice applied    Home Living Family/patient expects to be discharged to:: Skilled nursing facility                 Additional Comments: uncetain of prior  caregivers. Chart indicates that she lives w/ 2 sons. The patient appreared to have difficulty with language and understanding questions.    Prior Function                 Hand Dominance        Extremity/Trunk Assessment   Upper Extremity Assessment Upper Extremity Assessment: Overall WFL for tasks assessed    Lower Extremity Assessment Lower Extremity Assessment: RLE deficits/detail RLE Deficits / Details: didi bear weight on the right leg for transfers    Cervical / Trunk Assessment Cervical / Trunk Assessment: Normal  Communication   Communication: Prefers language other than English (appears some difficulty with [place])  Cognition Arousal/Alertness: Awake/alert Behavior During  Therapy: WFL for tasks assessed/performed Overall Cognitive Status: No family/caregiver present to determine baseline cognitive functioning                 General Comments: not oriented to place or time if patient understands.     General Comments      Exercises     Assessment/Plan    PT Assessment Patient needs continued PT services  PT Problem List Decreased  strength;Decreased range of motion;Decreased activity tolerance;Decreased balance;Decreased mobility;Decreased cognition;Decreased knowledge of precautions;Decreased safety awareness;Decreased knowledge of use of DME;Pain          PT Treatment Interventions DME instruction;Gait training;Functional mobility training;Therapeutic activities;Therapeutic exercise;Patient/family education    PT Goals (Current goals can be found in the Care Plan section)  Acute Rehab PT Goals Patient Stated Goal: the patient unable to state PT Goal Formulation: Patient unable to participate in goal setting Time For Goal Achievement: 10/04/16 Potential to Achieve Goals: Good    Frequency Min 3X/week   Barriers to discharge        Co-evaluation               End of Session   Activity Tolerance: Patient tolerated treatment well Patient left: in chair;with call bell/phone within reach;with chair alarm set Nurse Communication: Mobility status         Time: [id number] PT Time Calculation (min) (ACUTE ONLY): 12 min   Charges:   PT Evaluation $PT Eval Low Complexity: 1 Procedure     PT G CodesRada Burke:        Audrey Burke Audrey Burke 09/20/2016, 2:55 PM Audrey KelchKaren Vanity Burke PT [phone]

## 2016-09-20 NOTE — Progress Notes (Addendum)
RN called about whitish vaginal discharge.  Ordering a sterile swab of the discharge to collect sample of discharge and send to lab for wet prep and examine during rounds 1/24.

## 2016-09-20 NOTE — [place] FL2 (Signed)
[place] MEDICAID FL2 LEVEL OF CARE SCREENING TOOL     IDENTIFICATION  Patient Name: Audrey Burke Birthdate: 20-May-1932 Sex: female Admission Date (Current Location): 09/18/2016  [facility] [place]   Facility and Address:  [facility],  [address], [place] [id number]      Provider Number: [id number]  Attending Physician Name and Address:  Cleora Fleetlanford L Johnson, MD  Relative Name and Phone Number:       Current Level of Care: Hospital Recommended Level of Care: Skilled Nursing Facility Prior Approval Number:    Date Approved/Denied:   PASRR Number: [id number]  Discharge Plan: SNF    Current Diagnoses: Patient Active Problem List   Diagnosis Date Noted  . Closed right hip fracture (HCC) 09/18/2016  . Leukocytosis 09/18/2016  . Hip fracture (HCC) 09/18/2016  . Paroxysmal a-fib (HCC) 09/18/2016  . CHF (congestive heart failure), NYHA class I (HCC) 09/18/2016  . Prolonged QT interval 09/18/2016  . HTN (hypertension) 05/14/2012  . Chronic pain 05/14/2012    Orientation RESPIRATION BLADDER Height & Weight     Self, Time, Situation, Place  O2 (2L) Continent Weight: 191 lb 9.3 oz (86.9 kg) Height:  5\' 3"  (160 cm)  BEHAVIORAL SYMPTOMS/MOOD NEUROLOGICAL BOWEL NUTRITION STATUS      Continent Diet (Heart)  AMBULATORY STATUS COMMUNICATION OF NEEDS Skin   Extensive Assist Verbally Surgical wounds (Incision (Closed) 09/19/16 Hip Right)                       Personal Care Assistance Level of Assistance  Bathing, Dressing Bathing Assistance: Limited assistance   Dressing Assistance: Limited assistance     Functional Limitations Info             SPECIAL CARE FACTORS FREQUENCY  PT (By licensed PT), OT (By licensed OT)     PT Frequency: 5 OT Frequency: 5            Contractures      Additional Factors Info  Code Status, Allergies Code Status Info: Fullcode Allergies Info: NKDA           Current  Medications (09/20/2016):  This is the current hospital active medication list Current Facility-Administered Medications  Medication Dose Route Frequency Provider Last Rate Last Dose  . acetaminophen (TYLENOL) tablet 650 mg  650 mg Oral Q6H PRN Ollen GrossFrank Aluisio, MD       Or  . acetaminophen (TYLENOL) suppository 650 mg  650 mg Rectal Q6H PRN Ollen GrossFrank Aluisio, MD      . amLODipine (NORVASC) tablet 5 mg  5 mg Oral Daily Therisa DoyneAnastassia Doutova, MD   5 mg at 09/20/16 0959  . bisacodyl (DULCOLAX) suppository 10 mg  10 mg Rectal Daily PRN Therisa DoyneAnastassia Doutova, MD      . enoxaparin (LOVENOX) injection 40 mg  40 mg Subcutaneous Q24H Ollen GrossFrank Aluisio, MD   40 mg at 09/20/16 0958  . famotidine (PEPCID) tablet 20 mg  20 mg Oral Daily Therisa DoyneAnastassia Doutova, MD   20 mg at 09/20/16 0959  . hydrALAZINE (APRESOLINE) injection 15 mg  15 mg Intravenous Q4H PRN [place] Cyndie MullL Johnson, MD      . HYDROcodone-acetaminophen (NORCO/VICODIN) 5-325 MG per tablet 1-2 tablet  1-2 tablet Oral Q6H PRN Therisa DoyneAnastassia Doutova, MD   2 tablet at 09/20/16 0959  . menthol-cetylpyridinium (CEPACOL) lozenge 3 mg  1 lozenge Oral PRN Ollen GrossFrank Aluisio, MD       Or  . phenol (CHLORASEPTIC) mouth spray 1 spray  1 spray Mouth/Throat PRN Ollen Gross, MD      . methocarbamol (ROBAXIN) tablet 500 mg  500 mg Oral Q6H PRN Therisa Doyne, MD       Or  . methocarbamol (ROBAXIN) 500 mg in dextrose 5 % 50 mL IVPB  500 mg Intravenous Q6H PRN Therisa Doyne, MD   500 mg at 09/19/16 1949  . metoprolol succinate (TOPROL-XL) 24 hr tablet 50 mg  50 mg Oral Daily Therisa Doyne, MD   50 mg at 09/20/16 0959  . morphine 2 MG/ML injection 1 mg  1 mg Intravenous Q2H PRN Ollen Gross, MD   1 mg at 09/20/16 0516  . polyethylene glycol (MIRALAX / GLYCOLAX) packet 17 g  17 g Oral Daily PRN Therisa Doyne, MD      . traMADol (ULTRAM) tablet 50-100 mg  50-100 mg Oral Q6H PRN Ollen Gross, MD         Discharge Medications: Please see discharge summary for a list of  discharge medications.  Relevant Imaging Results:  Relevant Lab Results:   Additional Information SSN: [id number]  Arlyss Repress, LCSW

## 2016-09-20 NOTE — Progress Notes (Signed)
Patient continued to be combative with staff, pulling at oxygen tubing and tele wires when she arrived from PACU.  NP on call was notified and order received to continue use of wrist restraints on unit. Pt very confused and uncooperative with staff.  Have continued to monitor patient closely throughout the night.

## 2016-09-21 DIAGNOSIS — S72001A Fracture of unspecified part of neck of right femur, initial encounter for closed fracture: Secondary | ICD-10-CM

## 2016-09-21 DIAGNOSIS — R9431 Abnormal electrocardiogram [ECG] [EKG]: Secondary | ICD-10-CM

## 2016-09-21 DIAGNOSIS — I509 Heart failure, unspecified: Secondary | ICD-10-CM

## 2016-09-21 DIAGNOSIS — I480 Paroxysmal atrial fibrillation: Secondary | ICD-10-CM

## 2016-09-21 LAB — BASIC METABOLIC PANEL
Anion gap: 5 (ref 5–15)
BUN: 35 mg/dL — AB (ref 6–20)
CHLORIDE: 106 mmol/L (ref 101–111)
CO2: 26 mmol/L (ref 22–32)
CREATININE: 0.97 mg/dL (ref 0.44–1.00)
Calcium: 8.8 mg/dL — ABNORMAL LOW (ref 8.9–10.3)
GFR calc Af Amer: >60 mL/min (ref >60)
GFR, EST NON AFRICAN AMERICAN: 53 mL/min — AB (ref >60)
GLUCOSE: 132 mg/dL — AB (ref 65–99)
POTASSIUM: 4.2 mmol/L (ref 3.5–5.1)
Sodium: 137 mmol/L (ref 135–145)

## 2016-09-21 LAB — CBC
HCT: 33.1 % — ABNORMAL LOW (ref 36.0–46.0)
Hemoglobin: 10.6 g/dL — ABNORMAL LOW (ref 12.0–15.0)
MCH: 27.7 pg (ref 26.0–34.0)
MCHC: 32 g/dL (ref 30.0–36.0)
MCV: 86.4 fL (ref 78.0–100.0)
PLATELETS: 134 10*3/uL — AB (ref 150–400)
RBC: 3.83 MIL/uL — AB (ref 3.87–5.11)
RDW: 14.6 % (ref 11.5–15.5)
WBC: 11.8 10*3/uL — ABNORMAL HIGH (ref 4.0–10.5)

## 2016-09-21 MED ORDER — DILTIAZEM HCL 100 MG IV SOLR
5.0000 mg/h | INTRAVENOUS | Status: DC
  Filled 2016-09-21: qty 100

## 2016-09-21 MED ORDER — POLYETHYLENE GLYCOL 3350 17 G PO PACK: 17.0000 g | PACK | Freq: Every day | ORAL | 0 refills | Status: AC | PRN

## 2016-09-21 MED ORDER — DILTIAZEM LOAD VIA INFUSION
15.0000 mg | Freq: Once | INTRAVENOUS | Status: DC
  Filled 2016-09-21: qty 15

## 2016-09-21 MED ORDER — HYDROCODONE-ACETAMINOPHEN 5-325 MG PO TABS: 1.0000 | ORAL_TABLET | Freq: Four times a day (QID) | ORAL | 0 refills | Status: DC | PRN

## 2016-09-21 MED ORDER — BISACODYL 10 MG RE SUPP: 10.0000 mg | Freq: Every day | RECTAL | 0 refills | Status: AC | PRN

## 2016-09-21 MED ORDER — MUPIROCIN 2 % EX OINT
1.0000 "application " | TOPICAL_OINTMENT | Freq: Two times a day (BID) | CUTANEOUS | Status: DC
  Administered 2016-09-21 – 2016-09-22 (×3): 1 via NASAL
  Filled 2016-09-21: qty 22

## 2016-09-21 MED ORDER — CHLORHEXIDINE GLUCONATE CLOTH 2 % EX PADS
6.0000 | MEDICATED_PAD | Freq: Every day | CUTANEOUS | Status: DC
  Administered 2016-09-21 – 2016-09-22 (×2): 6 via TOPICAL

## 2016-09-21 MED ORDER — DILTIAZEM HCL 60 MG PO TABS
60.0000 mg | ORAL_TABLET | Freq: Three times a day (TID) | ORAL | Status: DC
  Administered 2016-09-21 – 2016-09-22 (×3): 60 mg via ORAL
  Filled 2016-09-21 (×3): qty 1

## 2016-09-21 MED ORDER — DILTIAZEM HCL 25 MG/5ML IV SOLN
15.0000 mg | Freq: Once | INTRAVENOUS | Status: AC
  Administered 2016-09-21: 15 mg via INTRAVENOUS
  Filled 2016-09-21: qty 5

## 2016-09-21 NOTE — Clinical Social Work Placement (Signed)
CSW reviewed PT evaluation recommending SNF at discharge - spoke with patient, son - Deinos & her sister at bedside re: discharge planning. They are agreeable with plan for SNF, [facility]. CSW [facility] that [facility] would be able to take patient. Anticipating discharge today.    Lincoln MaxinKelly Caledonia Zou, LCSW [facility] Clinical Social Worker cell #: [phone]    CLINICAL SOCIAL WORK PLACEMENT  NOTE  Date:  09/21/2016  Patient Details  Name: Audrey Burke MRN: [id number] Date of Birth: 06/20/1932  Clinical Social Work is seeking post-discharge placement for this patient at the Skilled  Nursing Facility level of care (*CSW will initial, date and re-position this form in  chart as items are completed):  Yes   Patient/family provided with [facility] Clinical Social Work Department's list of facilities offering this level of care within the geographic area requested by the patient (or if unable, by the patient's family).  Yes   Patient/family informed of their freedom to choose among providers that offer the needed level of care, that participate in Medicare, Medicaid or managed care program needed by the patient, have an available bed and are willing to accept the patient.  Yes   Patient/family informed of [place]'s ownership interest in [facility] and [facility], as well as of the fact that they are under no obligation to receive care at these facilities.  PASRR submitted to EDS on 09/21/16     PASRR number received on 09/21/16     Existing PASRR number confirmed on       FL2 transmitted to all facilities in geographic area requested by pt/family on 09/21/16     FL2 transmitted to all facilities within larger geographic area on       Patient informed that his/her managed care company has contracts with or will negotiate with certain facilities, including the following:        Yes   Patient/family informed of bed  offers received.  [facility]     Physician recommends and patient chooses bed at      [facility] on  .  Patient to be transferred to facility by       Patient family notified on   of transfer.  Name of family member notified:        PHYSICIAN       Additional Comment:    _______________________________________________ Arlyss RepressHarrison, Cobi Delph F, LCSW 09/21/2016, 10:38 AM

## 2016-09-21 NOTE — Progress Notes (Signed)
Writer noticed patients HR sustaining in 140s-150s and the rhythm change from NSR  to Afib. EKG obtained, VSS. MD notified.

## 2016-09-21 NOTE — Progress Notes (Signed)
New orders given and followed. Patients HR 85-100. Still in Afib. CMT told to call RN if patient converts back to NSR. MD aware, told to call if HR sustains above 110s. Will continue to monitor.

## 2016-09-21 NOTE — Progress Notes (Signed)
Subjective: 2 Days Post-Op Procedure(s) (LRB): INTRAMEDULLARY (IM) NAIL FEMORAL (Right) Patient reports pain as mild.    Objective: Vital signs in last 24 hours: Temp:  [97 F (36.1 C)-99.1 F (37.3 C)] 98.8 F (37.1 C) (01/24 1351) Pulse Rate:  [81-147] 98 (01/24 1450) Resp:  [20] 20 (01/24 1351) BP: (136-157)/(61-86) 156/69 (01/24 1351) SpO2:  [94 %-97 %] 94 % (01/24 1351)  Intake/Output from previous day: 01/23 0701 - 01/24 0700 In: 720 [P.O.:720] Out: 700 [Urine:700] Intake/Output this shift: Total I/O In: 480 [P.O.:480] Out: 300 [Urine:300]   Recent Labs  09/18/16 1715 09/19/16 0451 09/20/16 0536 09/21/16 0442  HGB 13.3 12.7 11.8* 10.6*    Recent Labs  09/20/16 0536 09/21/16 0442  WBC 11.8* 11.8*  RBC 4.25 3.83*  HCT 35.9* 33.1*  PLT 157 134*    Recent Labs  09/20/16 0536 09/21/16 0442  NA 135 137  K 4.1 4.2  CL 104 106  CO2 21* 26  BUN 13 35*  CREATININE 0.65 0.97  GLUCOSE 185* 132*  CALCIUM 8.8* 8.8*    Recent Labs  09/18/16 1715  INR 1.32    Neurologically intact Neurovascular intact No cellulitis present Compartment soft  Assessment/Plan: 2 Days Post-Op Procedure(s) (LRB): INTRAMEDULLARY (IM) NAIL FEMORAL (Right) Advance diet Up with therapy  Tamberly Pomplun V 09/21/2016, 4:45 PM

## 2016-09-21 NOTE — Care Management Note (Signed)
Case Management Note  Patient Details  Name: Audrey Burke MRN: [id number] Date of Birth: [birth year]  Subjective/Objective:                    Action/Plan:D/C SNF.   Expected Discharge Date:  09/21/16               Expected Discharge Plan:  Skilled Nursing Facility  In-House Referral:  Clinical Social Work  Discharge planning Services  CM Consult  Post Acute Care Choice:    Choice offered to:     DME Arranged:    DME Agency:     HH Arranged:    HH Agency:     Status of Service:  Completed, signed off  If discussed at [employer] [employer] of [employer] Meetings, dates discussed:    Additional Comments:  Lanier ClamMahabir, Jaivyn Gulla, RN 09/21/2016, 1:54 PM

## 2016-09-21 NOTE — Discharge Summary (Addendum)
Physician Discharge Summary  Audrey Burke ZOX:[id number] DOB: 1932/03/03 DOA: 09/18/2016  PCP: Georgann Housekeeper, MD  Admit date: 09/18/2016 Discharge date: 09/22/2016  Admitted From: home  Disposition:  SNF   Recommendations for Outpatient Follow-up:  1. F/u with ortho in 2 wks 2. Daily weights- patient needs to remember to do this at home     Discharge Condition:  stable CODE STATUS:  Full code   Diet recommendation:  Heart heatlhy Consultations:  Ortho- Dr Despina Hick    Discharge Diagnoses:  Active Problems:   Closed right hip fracture (HCC)   Leukocytosis   Paroxysmal a-fib (HCC)   CHF (congestive heart failure), NYHA class I (HCC)   Prolonged QT interval    Subjective: No complaints, pain is controlled with medications.   Brief Summary: Presented with right hip pain after fall. Patient reports that she tripped while trying to do laundry falling onto awooden deck,did not lose consciousness,did not hit her head no headache. She called EMS and was found in the ER to have a right intertrochanteric fracture.   Hospital Course:  Closed right hip fracture -  s/p IM nailing on 1/22 by Dr Despina Hick  Leukocytosis - likely reactive from surgery and also due to Decadron IV injection 1/22.  No evidence of infection found. WBC count now 9.3.   Paroxysmal atrial fibrillation- resume Xarelto which was held for surgery- Metoprolol was continued however she developed A-fib with RVR yesterday with HR in 120-130 range. Short acting Cardizem was added and has been transitioned to long acting Cardizem today. She has converted back to NSR. Please continue to follow HR and BP (due to new start on Cardizem) during SNF stay.  Chronic Diastolic CHF - No acute CHF.- has grade 1 dCHF.  Holding lasix for now and use only PRN for fluid gain.  Acute blood loss anemia- Hb dropped from 13. 3 to 10- no need to transfuse     Acute post op delirium - has resolved  Prolonged QTc -has resolved.    Essential Hypertension -  D/c amlodipine in place of Cardizem, cont Metoprolol and Olmesartan.  Discharge Instructions  Discharge Instructions    (HEART FAILURE PATIENTS) Call MD:  Anytime you have any of the following symptoms: 1) 3 pound weight gain in 24 hours or 5 pounds in 1 week 2) shortness of breath, with or without a dry hacking cough 3) swelling in the hands, feet or stomach 4) if you have to sleep on extra pillows at night in order to breathe.    Complete by:  As directed    Call MD / Call 911    Complete by:  As directed    If you experience chest pain or shortness of breath, CALL 911 and be transported to the hospital emergency room.  If you develope a fever above 101 F, pus (white drainage) or increased drainage or redness at the wound, or calf pain, call your surgeon's office.   Change dressing    Complete by:  As directed    You may change your dressing dressing daily with sterile 4 x 4 inch gauze dressing and paper tape.  Do not submerge the incision under water.   Diet - low sodium heart healthy    Complete by:  As directed    Diet Carb Modified    Complete by:  As directed    Discharge instructions    Complete by:  As directed    Pick up stool softner and laxative for home use following  surgery while on pain medications. Do not submerge incision under water. Please use good hand washing techniques while changing dressing each day. May shower starting three days after surgery. Please use a clean towel to pat the incision dry following showers. Continue to use ice for pain and swelling after surgery. Do not use any lotions or creams on the incision until instructed by your surgeon.  Wear both TED hose on both legs during the day every day for three weeks, but may have off at night at home.  Postoperative Constipation Protocol  Constipation - defined medically as fewer than three stools per week and severe constipation as less than one stool per week.  One of the  most common issues patients have following surgery is constipation.  Even if you have a regular bowel pattern at home, your normal regimen is likely to be disrupted due to multiple reasons following surgery.  Combination of anesthesia, postoperative narcotics, change in appetite and fluid intake all can affect your bowels.  In order to avoid complications following surgery, here are some recommendations in order to help you during your recovery period.  Colace (docusate) - Pick up an over-the-counter form of Colace or another stool softener and take twice a day as long as you are requiring postoperative pain medications.  Take with a full glass of water daily.  If you experience loose stools or diarrhea, hold the colace until you stool forms back up.  If your symptoms do not get better within 1 week or if they get worse, check with your doctor.  Dulcolax (bisacodyl) - Pick up over-the-counter and take as directed by the product packaging as needed to assist with the movement of your bowels.  Take with a full glass of water.  Use this product as needed if not relieved by Colace only.   MiraLax (polyethylene glycol) - Pick up over-the-counter to have on hand.  MiraLax is a solution that will increase the amount of water in your bowels to assist with bowel movements.  Take as directed and can mix with a glass of water, juice, soda, coffee, or tea.  Take if you go more than two days without a movement. Do not use MiraLax more than once per day. Call your doctor if you are still constipated or irregular after using this medication for 7 days in a row.  If you continue to have problems with postoperative constipation, please contact the office for further assistance and recommendations.  If you experience "the worst abdominal pain ever" or develop nausea or vomiting, please contact the office immediatly for further recommendations for treatment.   Resume the Xarelto 20 mg daily at time of discharge.   Do not  sit on low chairs, stoools or toilet seats, as it may be difficult to get up from low surfaces    Complete by:  As directed    Driving restrictions    Complete by:  As directed    No driving until released by the physician.   Increase activity slowly    Complete by:  As directed    Increase activity slowly as tolerated    Complete by:  As directed    Lifting restrictions    Complete by:  As directed    No lifting until released by the physician.   Patient may shower    Complete by:  As directed    You may shower without a dressing once there is no drainage.  Do not wash over the wound.  If drainage remains, do not shower until drainage stops.   TED hose    Complete by:  As directed    Use stockings (TED hose) for 3 weeks on both leg(s).  You may remove them at night for sleeping.   Weight bearing as tolerated    Complete by:  As directed    Laterality:  right   Extremity:  Lower     Allergies as of 09/22/2016   No Known Allergies     Medication List    STOP taking these medications   amLODipine 5 MG tablet Commonly known as:  NORVASC   diclofenac sodium 1 % Gel Commonly known as:  VOLTAREN   oxyCODONE-acetaminophen 5-325 MG tablet Commonly known as:  PERCOCET/ROXICET     TAKE these medications   bisacodyl 10 MG suppository Commonly known as:  DULCOLAX Place 1 suppository (10 mg total) rectally daily as needed for moderate constipation.   cyclobenzaprine 10 MG tablet Commonly known as:  FLEXERIL Take 0.5 tablets (5 mg total) by mouth 2 (two) times daily as needed for muscle spasms.   diltiazem 180 MG 24 hr capsule Commonly known as:  CARDIZEM CD Take 1 capsule (180 mg total) by mouth daily.   famotidine 20 MG tablet Commonly known as:  PEPCID Take 20 mg by mouth daily.   furosemide 20 MG tablet Commonly known as:  LASIX Take 2 tablets (40 mg total) by mouth daily as needed. For fluid or weight gain What changed:  how much to take  when to take  this  reasons to take this  additional instructions   HYDROcodone-acetaminophen 5-325 MG tablet Commonly known as:  NORCO/VICODIN Take 1 tablet by mouth every 6 (six) hours as needed for moderate pain.   metoprolol succinate 50 MG 24 hr tablet Commonly known as:  TOPROL-XL Take 1 tablet (50 mg total) by mouth daily. PATIENT NEEDS OFFICE VISIT FOR ADDITIONAL REFILLS - 2nd NOTICE   mirtazapine 30 MG tablet Commonly known as:  REMERON Take 30 mg by mouth at bedtime.   olmesartan 40 MG tablet Commonly known as:  BENICAR Take 1 tablet (40 mg total) by mouth daily. Needs office visit-no further refills   polyethylene glycol packet Commonly known as:  MIRALAX / GLYCOLAX Take 17 g by mouth daily as needed for mild constipation.   XARELTO 20 MG Tabs tablet Generic drug:  rivaroxaban Take 20 mg by mouth daily.       Contact information for follow-up providers    Loanne Drilling, MD. Schedule an appointment as soon as possible for a visit on 10/04/2016.   Specialty:  Orthopedic Surgery Why:  Call office ASAP at [phone] to setup appointment on Tuesday 10/04/2016 with Dr. Lequita Halt. Contact information: [address] [place] [address] [phone]            Contact information for after-discharge care    Destination    [facility] Follow up.   Specialty:  Skilled Nursing Facility Contact information: [address] [place] [address] [phone]                 No Known Allergies   Procedures/Studies: Intramedullary nailing, Right intertrochanteric femur  fracture.    Dg Knee 1-2 Views Right  Result Date: 09/18/2016 CLINICAL DATA:  RIGHT hip fracture. EXAM: RIGHT KNEE - 1-2 VIEW COMPARISON:  None FINDINGS: Two views of the RIGHT knee in nonstandard orientation. No clear evidence of fracture. There is narrowing of the  medial compartment. No joint effusion. IMPRESSION: No gross evidence of RIGHT  knee fracture. Electronically Signed   By: Genevive BiStewart  Edmunds M.D.   On: 09/18/2016 17:44   Ct Head Wo Contrast  Result Date: 09/18/2016 CLINICAL DATA:  Trip and fell onto wooden deck, no LOC. No head injury EXAM: CT HEAD WITHOUT CONTRAST TECHNIQUE: Contiguous axial images were obtained from the base of the skull through the vertex without intravenous contrast. COMPARISON:  Head CT 06/05/2011 FINDINGS: Brain: No acute intracranial hemorrhage. No focal mass lesion. No CT evidence of acute infarction. No midline shift or mass effect. No hydrocephalus. Basilar cisterns are patent. Calcifications within LEFT RIGHT basal ganglia unchanged from comparison CT. Moderate periventricular white matter hypodensity. Moderate atrophy. Head motion degrades skullbase evaluation. Vascular: No hyperdense vessel or unexpected calcification. Skull: Normal. Negative for fracture or focal lesion. Head motion does degrade skullbase evaluation Sinuses/Orbits: Paranasal sinuses and mastoid air cells are clear. Orbits are clear. Other: None. IMPRESSION: 1. No intracranial trauma. 2. Head motion degrades skullbase evaluation. 3. Atrophy and white matter microvascular disease. Electronically Signed   By: Genevive BiStewart  Edmunds M.D.   On: 09/18/2016 18:15   Dg Chest Port 1 View  Result Date: 09/18/2016 CLINICAL DATA:  Initial evaluation for preoperative evaluation. EXAM: PORTABLE CHEST 1 VIEW COMPARISON:  Prior radiograph from 06/20/2011. FINDINGS: Moderate cardiomegaly, stable from previous. Mediastinal silhouette within normal limits. Lungs mildly hypoinflated. Central perihilar vascular congestion without overt pulmonary edema. No pleural effusion. No focal infiltrates. No pneumothorax. No acute osseous abnormality. IMPRESSION: 1. Cardiomegaly with mild central perihilar vascular congestion without overt pulmonary edema. 2. No other active cardiopulmonary disease. Electronically Signed   By: Rise MuBenjamin  McClintock M.D.   On: 09/18/2016 20:14    Dg C-arm 1-60 Min-no Report  Result Date: 09/19/2016 There is no Radiologist interpretation  for this exam.  Dg Hip Unilat  With Pelvis 2-3 Views Right  Result Date: 09/18/2016 CLINICAL DATA:  Pt c/o severe RIGHT hip pain s/p tripping over step from laundry room into kitchen at home this evening, denies prior fx's this leg. EXAM: DG HIP (WITH OR WITHOUT PELVIS) 2-3V RIGHT COMPARISON:  None. FINDINGS: Intertrochanteric fracture of the proximal RIGHT femur with varus angulation. The femoral head is located. No pelvic fracture. IMPRESSION: Intertrochanteric fracture proximal RIGHT femur. Electronically Signed   By: Genevive BiStewart  Edmunds M.D.   On: 09/18/2016 17:40   Dg Femur, Min 2 Views Right  Result Date: 09/19/2016 CLINICAL DATA:  Status post internal fixation of right femoral fracture. Initial encounter. EXAM: RIGHT FEMUR 2 VIEWS COMPARISON:  Right hip radiographs performed 09/18/2016 FINDINGS: Three fluoroscopic C-arm images are provided from the OR, demonstrating placement of a dynamic hip screw at the right femur, transfixing the intertrochanteric fracture in grossly anatomic alignment. No new fractures are seen. The right femoral head remains seated at the acetabulum. IMPRESSION: Status post internal fixation of right femoral fracture in grossly anatomic alignment. Electronically Signed   By: Roanna RaiderJeffery  Chang M.D.   On: 09/19/2016 18:44       Discharge Exam: Vitals:   09/21/16 2259 09/22/16 0457  BP: 130/61 (!) 147/58  Pulse: 90 80  Resp:  (!) 22  Temp:  98.7 F (37.1 C)   Vitals:   09/21/16 1450 09/21/16 2056 09/21/16 2259 09/22/16 0457  BP:  131/81 130/61 (!) 147/58  Pulse: 98 70 90 80  Resp:  20  (!) 22  Temp:  99.4 F (37.4 C)  98.7 F (37.1 C)  TempSrc:  Oral  Oral  SpO2:  92%  93%  Weight:      Height:        General: Pt is alert, awake, not in acute distress Cardiovascular: RRR, S1/S2 +, no rubs, no gallops Respiratory: CTA bilaterally, no wheezing, no  rhonchi Abdominal: Soft, NT, ND, bowel sounds + Extremities: no edema, no cyanosis    The results of significant diagnostics from this hospitalization (including imaging, microbiology, ancillary and laboratory) are listed below for reference.     Microbiology: Recent Results (from the past 240 hour(s))  Surgical pcr screen     Status: Abnormal   Collection Time: 09/19/16  1:37 PM  Result Value Ref Range Status   MRSA, PCR NEGATIVE NEGATIVE Final   Staphylococcus aureus POSITIVE (A) NEGATIVE Final    Comment:        The Xpert SA Assay (FDA approved for NASAL specimens in patients over 72 years of age), is one component of a comprehensive surveillance program.  Test performance has been validated by [facility] for patients greater than or equal to 48 year old. It is not intended to diagnose infection nor to guide or monitor treatment.   Wet prep, genital     Status: Abnormal   Collection Time: 09/20/16  7:30 PM  Result Value Ref Range Status   Yeast Wet Prep HPF POC NONE SEEN NONE SEEN Final   Trich, Wet Prep NONE SEEN NONE SEEN Final   Clue Cells Wet Prep HPF POC NONE SEEN NONE SEEN Final   WBC, Wet Prep HPF POC MODERATE (A) NONE SEEN Final   Sperm NONE SEEN  Final     Labs: BNP (last 3 results) No results for input(s): BNP in the last 8760 hours. Basic Metabolic Panel:  Recent Labs Lab 09/18/16 1715 09/19/16 0451 09/20/16 0536 09/21/16 0442  NA 137 138 135 137  K 4.2 4.1 4.1 4.2  CL 106 106 104 106  CO2 24 25 21* 26  GLUCOSE 131* 136* 185* 132*  BUN 18 15 13  35*  CREATININE 0.99 0.69 0.65 0.97  CALCIUM 9.0 8.7* 8.8* 8.8*   Liver Function Tests:  Recent Labs Lab 09/19/16 0451  ALBUMIN 3.5   No results for input(s): LIPASE, AMYLASE in the last 168 hours. No results for input(s): AMMONIA in the last 168 hours. CBC:  Recent Labs Lab 09/18/16 1715 09/19/16 0451 09/20/16 0536 09/21/16 0442 09/22/16 0548  WBC 13.5* 10.9* 11.8* 11.8* 9.3   NEUTROABS 11.1*  --   --   --   --   HGB 13.3 12.7 11.8* 10.6* 10.0*  HCT 39.7 38.7 35.9* 33.1* 30.3*  MCV 83.1 84.5 84.5 86.4 86.3  PLT 158 149* 157 134* 163   Cardiac Enzymes: No results for input(s): CKTOTAL, CKMB, CKMBINDEX, TROPONINI in the last 168 hours. BNP: Invalid input(s): POCBNP CBG: No results for input(s): GLUCAP in the last 168 hours. D-Dimer No results for input(s): DDIMER in the last 72 hours. Hgb A1c No results for input(s): HGBA1C in the last 72 hours. Lipid Profile No results for input(s): CHOL, HDL, LDLCALC, TRIG, CHOLHDL, LDLDIRECT in the last 72 hours. Thyroid function studies No results for input(s): TSH, T4TOTAL, T3FREE, THYROIDAB in the last 72 hours.  Invalid input(s): FREET3 Anemia work up No results for input(s): VITAMINB12, FOLATE, FERRITIN, TIBC, IRON, RETICCTPCT in the last 72 hours. Urinalysis    Component Value Date/Time   COLORURINE STRAW (A) 09/18/2016 2325   APPEARANCEUR CLEAR 09/18/2016 2325   LABSPEC 1.013 09/18/2016 2325  PHURINE 8.0 09/18/2016 2325   GLUCOSEU NEGATIVE 09/18/2016 2325   HGBUR NEGATIVE 09/18/2016 2325   BILIRUBINUR NEGATIVE 09/18/2016 2325   KETONESUR NEGATIVE 09/18/2016 2325   PROTEINUR NEGATIVE 09/18/2016 2325   UROBILINOGEN 0.2 05/26/2015 2104   NITRITE NEGATIVE 09/18/2016 2325   LEUKOCYTESUR NEGATIVE 09/18/2016 2325   Sepsis Labs Invalid input(s): PROCALCITONIN,  WBC,  LACTICIDVEN Microbiology Recent Results (from the past 240 hour(s))  Surgical pcr screen     Status: Abnormal   Collection Time: 09/19/16  1:37 PM  Result Value Ref Range Status   MRSA, PCR NEGATIVE NEGATIVE Final   Staphylococcus aureus POSITIVE (A) NEGATIVE Final    Comment:        The Xpert SA Assay (FDA approved for NASAL specimens in patients over 17 years of age), is one component of a comprehensive surveillance program.  Test performance has been validated by [facility] for patients greater than or equal to 72 year old. It  is not intended to diagnose infection nor to guide or monitor treatment.   Wet prep, genital     Status: Abnormal   Collection Time: 09/20/16  7:30 PM  Result Value Ref Range Status   Yeast Wet Prep HPF POC NONE SEEN NONE SEEN Final   Trich, Wet Prep NONE SEEN NONE SEEN Final   Clue Cells Wet Prep HPF POC NONE SEEN NONE SEEN Final   WBC, Wet Prep HPF POC MODERATE (A) NONE SEEN Final   Sperm NONE SEEN  Final     Time coordinating discharge: Over 30 minutes  SIGNED:   Calvert Cantor, MD  [facility] 09/22/2016, 9:43 AM Pager   If 7PM-7AM, please contact night-coverage [email or web address] Password [email or web address]

## 2016-09-21 NOTE — Progress Notes (Signed)
Discharge to SNF cancelled due to A-fib with RVR- will give IV Cardizem push and start oral Cardizem. She is asymptomatic.    Calvert CantorSaima Julius Boniface, MD

## 2016-09-22 LAB — CBC
HCT: 30.3 % — ABNORMAL LOW (ref 36.0–46.0)
Hemoglobin: 10 g/dL — ABNORMAL LOW (ref 12.0–15.0)
MCH: 28.5 pg (ref 26.0–34.0)
MCHC: 33 g/dL (ref 30.0–36.0)
MCV: 86.3 fL (ref 78.0–100.0)
PLATELETS: 163 10*3/uL (ref 150–400)
RBC: 3.51 MIL/uL — AB (ref 3.87–5.11)
RDW: 14.9 % (ref 11.5–15.5)
WBC: 9.3 10*3/uL (ref 4.0–10.5)

## 2016-09-22 MED ORDER — DILTIAZEM HCL ER COATED BEADS 180 MG PO CP24: 180.0000 mg | ORAL_CAPSULE | Freq: Every day | ORAL | 0 refills | Status: AC

## 2016-09-22 MED ORDER — DILTIAZEM HCL ER COATED BEADS 180 MG PO CP24
180.0000 mg | ORAL_CAPSULE | Freq: Every day | ORAL | Status: DC
  Administered 2016-09-22: 180 mg via ORAL
  Filled 2016-09-22: qty 1

## 2016-09-22 MED ORDER — LORAZEPAM 2 MG/ML IJ SOLN: 1.0000 mg | Freq: Once | INTRAMUSCULAR | Status: DC

## 2016-09-22 MED ORDER — HYDROCODONE-ACETAMINOPHEN 5-325 MG PO TABS: 1.0000 | ORAL_TABLET | Freq: Four times a day (QID) | ORAL | 0 refills | Status: DC | PRN

## 2016-09-22 MED ORDER — FUROSEMIDE 20 MG PO TABS: 40.0000 mg | ORAL_TABLET | Freq: Every day | ORAL | 0 refills | Status: AC | PRN

## 2016-09-22 NOTE — Progress Notes (Signed)
Pt IV removed. Tele d/c'd, Pt was NSR HR in 70s.  Living well w/ CHF booklet and information reviewed at bedside and questions answered.  Report called to receiving facility, no further questions from pt or facility. Justin Mendaudle, Damesha Lawler H, RN

## 2016-09-22 NOTE — Clinical Social Work Placement (Signed)
[facility] is [facility] to [facility]. Patient & sons - Deinos & [name] at bedside made aware. Discharge packet given to RN, Irving Burtonmily. [facility] called for transport to pickup at 1:30pm.     Lincoln MaxinKelly Khamiya Varin, LCSW [facility] Clinical Social Worker cell #: [phone]    CLINICAL SOCIAL WORK PLACEMENT  NOTE  Date:  09/22/2016  Patient Details  Name: Audrey Burke MRN: [id number] Date of Birth: 01/05/1932  Clinical Social Work is seeking post-discharge placement for this patient at the Skilled  Nursing Facility level of care (*CSW will initial, date and re-position this form in  chart as items are completed):  Yes   Patient/family provided with [facility] Clinical Social Work Department's list of facilities offering this level of care within the geographic area requested by the patient (or if unable, by the patient's family).  Yes   Patient/family informed of their freedom to choose among providers that offer the needed level of care, that participate in Medicare, Medicaid or managed care program needed by the patient, have an available bed and are willing to accept the patient.  Yes   Patient/family informed of [facility]'s ownership interest in [facility] and [facility], as well as of the fact that they are under no obligation to receive care at these facilities.  PASRR submitted to EDS on 09/21/16     PASRR number received on 09/21/16     Existing PASRR number confirmed on       FL2 transmitted to all facilities in geographic area requested by pt/family on 09/21/16     FL2 transmitted to all facilities within larger geographic area on       Patient informed that his/her managed care company has contracts with or will negotiate with certain facilities, including the following:        Yes   Patient/family informed of bed offers received.  [facility]     Physician recommends and patient chooses bed  at      [facility].  Patient to be transferred to facility by [facility]     Patient family notified on 09/22/16 of transfer.  Name of family member notified:  patient's sons, Deinos & [name] at bedside     PHYSICIAN       Additional Comment:    _______________________________________________ Arlyss RepressHarrison, Jdyn Parkerson F, LCSW 09/22/2016, 11:17 AM

## 2016-09-22 NOTE — Progress Notes (Signed)
   Subjective: 3 Days Post-Op Procedure(s) (LRB): INTRAMEDULLARY (IM) NAIL FEMORAL (Right) Patient reports pain as mild.   Patient seen in rounds with Dr. Lequita HaltAluisio. Patient is well, and has had no acute complaints or problems Plan is to go Skilled nursing facility after hospital stay.  Objective: Vital signs in last 24 hours: Temp:  [98.7 F (37.1 C)-99.4 F (37.4 C)] 98.7 F (37.1 C) (01/25 0457) Pulse Rate:  [70-147] 80 (01/25 0457) Resp:  [20-22] 22 (01/25 0457) BP: (130-156)/(58-81) 147/58 (01/25 0457) SpO2:  [92 %-95 %] 93 % (01/25 0457)  Intake/Output from previous day:  Intake/Output Summary (Last 24 hours) at 09/22/16 0932 Last data filed at 09/22/16 0813  Gross per 24 hour  Intake              480 ml  Output              700 ml  Net             -220 ml    Intake/Output this shift: Total I/O In: -  Out: 200 [Urine:200]  Labs:  Recent Labs  09/20/16 0536 09/21/16 0442 09/22/16 0548  HGB 11.8* 10.6* 10.0*    Recent Labs  09/21/16 0442 09/22/16 0548  WBC 11.8* 9.3  RBC 3.83* 3.51*  HCT 33.1* 30.3*  PLT 134* 163    Recent Labs  09/20/16 0536 09/21/16 0442  NA 135 137  K 4.1 4.2  CL 104 106  CO2 21* 26  BUN 13 35*  CREATININE 0.65 0.97  GLUCOSE 185* 132*  CALCIUM 8.8* 8.8*   No results for input(s): LABPT, INR in the last 72 hours.  EXAM General - Patient is Alert and Appropriate Extremity - Neurovascular intact Sensation intact distally Dressing/Incision - clean, dry, no drainage Motor Function - intact, moving foot and toes well on exam.   Past Medical History:  Diagnosis Date  . Hypertension     Assessment/Plan: 3 Days Post-Op Procedure(s) (LRB): INTRAMEDULLARY (IM) NAIL FEMORAL (Right) Active Problems:   Closed right hip fracture (HCC)   Leukocytosis   Paroxysmal a-fib (HCC)   CHF (congestive heart failure), NYHA class I (HCC)   Prolonged QT interval  Estimated body mass index is 33.94 kg/m as calculated from the  following:   Height as of this encounter: 5\' 3"  (1.6 m).   Weight as of this encounter: 86.9 kg (191 lb 9.3 oz). Up with therapy Discharge to SNF when medically stable Okay to transfer from ortho standpoint.  DVT Prophylaxis - Xarelto 20 mg daily (she was taking at home). Can discontinue the lovenox at time of discharge. Weight Bearing As Tolerated right Leg Follow up in two weeks with Dr. Lequita HaltAluisio. Ortho RXs will be placed onto the chart.  Avel Peacerew Morgana Rowley, PA-C Orthopaedic Surgery 09/22/2016, 9:32 AM

## 2016-09-22 NOTE — Discharge Instructions (Signed)
Dr. Ollen GrossFrank Aluisio Total Joint Specialist [facility] [address] [place], [place] [id number] [phone]    HIP REPLACEMENT POSTOPERATIVE DIRECTIONS  Hip Rehabilitation, Guidelines Following Surgery  The results of a hip operation are greatly improved after range of motion and muscle strengthening exercises. Follow all safety measures which are given to protect your hip. If any of these exercises cause increased pain or swelling in your joint, decrease the amount until you are comfortable again. Then slowly increase the exercises. Call your caregiver if you have problems or questions.   HOME CARE INSTRUCTIONS  Remove items at home which could result in a fall. This includes throw rugs or furniture in walking pathways.   ICE to the affected hip every three hours for 30 minutes at a time and then as needed for pain and swelling.  Continue to use ice on the hip for pain and swelling from surgery. You may notice swelling that will progress down to the foot and ankle.  This is normal after surgery.  Elevate the leg when you are not up walking on it.    Continue to use the breathing machine which will help keep your temperature down.  It is common for your temperature to cycle up and down following surgery, especially at night when you are not up moving around and exerting yourself.  The breathing machine keeps your lungs expanded and your temperature down.  DIET You may resume your previous home diet once your are discharged from the hospital.  DRESSING / WOUND CARE / SHOWERING You may shower 3 days after surgery, but keep the wounds dry during showering.  You may use an occlusive plastic wrap (Press'n Seal for example), NO SOAKING/SUBMERGING IN THE BATHTUB.  If the bandage gets wet, change with a clean dry gauze.  If the incision gets wet, pat the wound dry with a clean towel. You may start showering once you are discharged home but do not submerge the incision  under water. Just pat the incision dry and apply a dry gauze dressing on daily. Change the surgical dressing daily and reapply a dry dressing each time.    ACTIVITY Walk with your walker as instructed. Use walker as long as suggested by your caregivers. Avoid periods of inactivity such as sitting longer than an hour when not asleep. This helps prevent blood clots.  You may resume a sexual relationship in one month or when given the OK by your doctor.  You may return to work once you are cleared by your doctor.  Do not drive a car for 6 weeks or until released by you surgeon.  Do not drive while taking narcotics.  WEIGHT BEARING Weight bearing as tolerated with assist device (walker, cane, etc) as directed, use it as long as suggested by your surgeon or therapist, typically at least 4-6 weeks.  POSTOPERATIVE CONSTIPATION PROTOCOL Constipation - defined medically as fewer than three stools per week and severe constipation as less than one stool per week.  One of the most common issues patients have following surgery is constipation.  Even if you have a regular bowel pattern at home, your normal regimen is likely to be disrupted due to multiple reasons following surgery.  Combination of anesthesia, postoperative narcotics, change in appetite and fluid intake all can affect your bowels.  In order to avoid complications following surgery, here are some recommendations in order to help you during your recovery period.  Colace (docusate) - Pick up an over-the-counter form  of Colace or another stool softener and take twice a day as long as you are requiring postoperative pain medications.  Take with a full glass of water daily.  If you experience loose stools or diarrhea, hold the colace until you stool forms back up.  If your symptoms do not get better within 1 week or if they get worse, check with your doctor. ° °Dulcolax (bisacodyl) - Pick up over-the-counter and take as directed by the product  packaging as needed to assist with the movement of your bowels.  Take with a full glass of water.  Use this product as needed if not relieved by Colace only.  ° °MiraLax (polyethylene glycol) - Pick up over-the-counter to have on hand.  MiraLax is a solution that will increase the amount of water in your bowels to assist with bowel movements.  Take as directed and can mix with a glass of water, juice, soda, coffee, or tea.  Take if you go more than two days without a movement. °Do not use MiraLax more than once per day. Call your doctor if you are still constipated or irregular after using this medication for 7 days in a row. ° °If you continue to have problems with postoperative constipation, please contact the office for further assistance and recommendations.  If you experience "the worst abdominal pain ever" or develop nausea or vomiting, please contact the office immediatly for further recommendations for treatment. ° °ITCHING ° If you experience itching with your medications, try taking only a single pain pill, or even half a pain pill at a time.  You can also use Benadryl over the counter for itching or also to help with sleep.  ° °TED HOSE STOCKINGS °Wear the elastic stockings on both legs for three weeks following surgery during the day but you may remove then at night for sleeping. ° °MEDICATIONS °See your medication summary on the “After Visit Summary” that the nursing staff will review with you prior to discharge.  You may have some home medications which will be placed on hold until you complete the course of blood thinner medication.  It is important for you to complete the blood thinner medication as prescribed by your surgeon.  Continue your approved medications as instructed at time of discharge. ° °PRECAUTIONS °If you experience chest pain or shortness of breath - call 911 immediately for transfer to the hospital emergency department.  °If you develop a fever greater that 101 F, purulent drainage  from wound, increased redness or drainage from wound, foul odor from the wound/dressing, or calf pain - CONTACT YOUR SURGEON.   °                                                °FOLLOW-UP APPOINTMENTS °Make sure you keep all of your appointments after your operation with your surgeon and caregivers. You should call the office at the above phone number and make an appointment for approximately two weeks after the date of your surgery or on the date instructed by your surgeon outlined in the "After Visit Summary". ° °RANGE OF MOTION AND STRENGTHENING EXERCISES  °These exercises are designed to help you keep full movement of your hip joint. Follow your caregiver's or physical therapist's instructions. Perform all exercises about fifteen times, three times per day or as directed. Exercise both hips, even if you have   had only one joint replacement. These exercises can be done on a training (exercise) mat, on the floor, on a table or on a bed. Use whatever works the best and is most comfortable for you. Use music or television while you are exercising so that the exercises are a pleasant break in your day. This will make your life better with the exercises acting as a break in routine you can look forward to.  Lying on your back, slowly slide your foot toward your buttocks, raising your knee up off the floor. Then slowly slide your foot back down until your leg is straight again.  Lying on your back spread your legs as far apart as you can without causing discomfort.  Lying on your side, raise your upper leg and foot straight up from the floor as far as is comfortable. Slowly lower the leg and repeat.  Lying on your back, tighten up the muscle in the front of your thigh (quadriceps muscles). You can do this by keeping your leg straight and trying to raise your heel off the floor. This helps strengthen the largest muscle supporting your knee.  Lying on your back, tighten up the muscles of your buttocks both with the  legs straight and with the knee bent at a comfortable angle while keeping your heel on the floor.      IF YOU ARE TRANSFERRED TO A SKILLED REHAB FACILITY If the patient is transferred to a skilled rehab facility following release from the hospital, a list of the current medications will be sent to the facility for the patient to continue.  When discharged from the skilled rehab facility, please have the facility set up the patient's Home Health Physical Therapy prior to being released. Also, the skilled facility will be responsible for providing the patient with their medications at time of release from the facility to include their pain medication, the muscle relaxants, and their blood thinner medication. If the patient is still at the rehab facility at time of the two week follow up appointment, the skilled rehab facility will also need to assist the patient in arranging follow up appointment in our office and any transportation needs.  MAKE SURE YOU:  Understand these instructions.  Get help right away if you are not doing well or get worse.    Pick up stool softner and laxative for home use following surgery while on pain medications. Do not submerge incision under water. Please use good hand washing techniques while changing dressing each day. May shower starting three days after surgery. Please use a clean towel to pat the incision dry following showers. Continue to use ice for pain and swelling after surgery. Do not use any lotions or creams on the incision until instructed by your surgeon.  Resume the Xarelto 20 mg Daily at time of discharge.    Hip Fracture A hip fracture is a fracture of the upper part of your thigh bone (femur). What are the causes? A hip fracture is caused by a direct blow to the side of your hip. This is usually the result of a fall but can occur in other circumstances, such as an automobile accident. What increases the risk? There is an increased risk of  hip fractures in people with:  An unsteady walking pattern (gait) and those with conditions that contribute to poor balance, such as Parkinson's disease or dementia.  Osteopenia and osteoporosis.  Cancer that spreads to the leg bones.  Certain metabolic diseases. What are the  signs or symptoms? Symptoms of hip fracture include:  Pain over the injured hip.  Inability to put weight on the leg in which the fracture occurred (although, some patients are able to walk after a hip fracture).  Toes and foot of the affected leg point outward when you lie down. How is this diagnosed? A physical exam can determine if a hip fracture is likely to have occurred. X-ray exams are needed to confirm the fracture and to look for other injuries. The X-ray exam can help to determine the type of hip fracture. Rarely, the fracture is not visible on an X-ray image and a CT scan or MRI will have to be done. How is this treated? The treatment for a fracture is usually surgery. This means using a screw, nail, or rod to hold the bones in place. Follow these instructions at home: Take all medicines as directed by your health care provider. Contact a health care provider if: Pain continues, even after taking pain medicine. This information is not intended to replace advice given to you by your health care provider. Make sure you discuss any questions you have with your health care provider. Document Released: 08/15/2005 Document Revised: 01/21/2016 Document Reviewed: 03/27/2013 Elsevier Interactive Patient Education  2017 [employer].

## 2016-09-22 NOTE — Care Management Important Message (Signed)
Important Message  Patient Details  Name: Audrey Burke MRN: [id number] Date of Birth: [birth year]   Medicare Important Message Given:  Yes    Caren MacadamFuller, Kariana Wiles 09/22/2016, 12:00 PMImportant Message  Patient Details  Name: Audrey Burke MRN: [id number] Date of Birth: [birth year]   Medicare Important Message Given:  Yes    Caren MacadamFuller, Jadin Kagel 09/22/2016, 11:59 AM

## 2016-09-22 NOTE — Progress Notes (Signed)
[facility]  Audrey Burke was not discharged as planned yesterday due to A-fib with RVR. HR improved with oral Cardizem. She later converted back to NSR.  Will continue Cardizem in addition to Lopressor. BP has not been low.    Have updated d/c summary from yesterday.   Stable for D/C to SNF.   [name], MD  Pager: [email or web address]

## 2016-10-07 ENCOUNTER — Other Ambulatory Visit: Payer: Self-pay | Admitting: *Deleted

## 2016-10-07 NOTE — Patient Outreach (Signed)
[facility]) Care Management  10/07/2016  Audrey Burke 1931/12/05 [id number]   Met with patient and her sister at facility.  Patient states that she plans to discharge to her son's home in [place], she will discharge next week. She reports that son will handle her transportation and medications and oversight. She hopes she will only be there around 3 weeks and then be back to [place].   RNCM reviewed [facility] services, patient states she will consider program upon her return home, happy to get something free. Gave brochure and magnet for future reference and encourage her to call upon return to [place].   Plan to sign off.  Royetta Crochet. Laymond Purser, RN, BSN, [place] [phone]) Business Cell  [phone]) Toll Free Office

## 2017-09-06 ENCOUNTER — Other Ambulatory Visit: Payer: Self-pay | Admitting: Internal Medicine

## 2017-09-06 DIAGNOSIS — Z1231 Encounter for screening mammogram for malignant neoplasm of breast: Secondary | ICD-10-CM

## 2017-10-10 ENCOUNTER — Ambulatory Visit
Admission: RE | Admit: 2017-10-10 | Discharge: 2017-10-10 | Disposition: A | Discharge status: Home / Self Care | Payer: Medicare Other | Source: Ambulatory Visit | Attending: Internal Medicine | Admitting: Internal Medicine

## 2017-10-10 DIAGNOSIS — Z1231 Encounter for screening mammogram for malignant neoplasm of breast: Secondary | ICD-10-CM

## 2017-10-25 ENCOUNTER — Emergency Department (HOSPITAL_COMMUNITY)
Admission: EM | Admit: 2017-10-25 | Discharge: 2017-10-26 | Disposition: A | Discharge status: Home / Self Care | Payer: Medicare Other | Attending: Emergency Medicine | Admitting: Emergency Medicine

## 2017-10-25 ENCOUNTER — Emergency Department (HOSPITAL_COMMUNITY): Payer: Medicare Other

## 2017-10-25 DIAGNOSIS — S0990XA Unspecified injury of head, initial encounter: Secondary | ICD-10-CM | POA: Insufficient documentation

## 2017-10-25 DIAGNOSIS — S2222XA Fracture of body of sternum, initial encounter for closed fracture: Secondary | ICD-10-CM | POA: Diagnosis not present

## 2017-10-25 DIAGNOSIS — Y92410 Unspecified street and highway as the place of occurrence of the external cause: Secondary | ICD-10-CM | POA: Diagnosis not present

## 2017-10-25 DIAGNOSIS — S8002XA Contusion of left knee, initial encounter: Secondary | ICD-10-CM | POA: Diagnosis not present

## 2017-10-25 DIAGNOSIS — Y999 Unspecified external cause status: Secondary | ICD-10-CM | POA: Insufficient documentation

## 2017-10-25 DIAGNOSIS — Y9389 Activity, other specified: Secondary | ICD-10-CM | POA: Insufficient documentation

## 2017-10-25 DIAGNOSIS — I110 Hypertensive heart disease with heart failure: Secondary | ICD-10-CM | POA: Diagnosis not present

## 2017-10-25 DIAGNOSIS — Z79899 Other long term (current) drug therapy: Secondary | ICD-10-CM | POA: Diagnosis not present

## 2017-10-25 DIAGNOSIS — Z7901 Long term (current) use of anticoagulants: Secondary | ICD-10-CM | POA: Insufficient documentation

## 2017-10-25 DIAGNOSIS — S62322A Displaced fracture of shaft of third metacarpal bone, right hand, initial encounter for closed fracture: Secondary | ICD-10-CM

## 2017-10-25 DIAGNOSIS — I509 Heart failure, unspecified: Secondary | ICD-10-CM | POA: Insufficient documentation

## 2017-10-25 DIAGNOSIS — S299XXA Unspecified injury of thorax, initial encounter: Secondary | ICD-10-CM | POA: Diagnosis present

## 2017-10-25 DIAGNOSIS — S62332A Displaced fracture of neck of third metacarpal bone, right hand, initial encounter for closed fracture: Secondary | ICD-10-CM | POA: Diagnosis not present

## 2017-10-25 LAB — I-STAT CHEM 8, ED
BUN: 21 mg/dL — ABNORMAL HIGH (ref 6–20)
Calcium, Ion: 1.18 mmol/L (ref 1.15–1.40)
Chloride: 105 mmol/L (ref 101–111)
Creatinine, Ser: 0.8 mg/dL (ref 0.44–1.00)
Glucose, Bld: 136 mg/dL — ABNORMAL HIGH (ref 65–99)
HEMATOCRIT: 42 % (ref 36.0–46.0)
HEMOGLOBIN: 14.3 g/dL (ref 12.0–15.0)
POTASSIUM: 3.8 mmol/L (ref 3.5–5.1)
SODIUM: 142 mmol/L (ref 135–145)
TCO2: 25 mmol/L (ref 22–32)

## 2017-10-25 MED ORDER — MORPHINE SULFATE (PF) 4 MG/ML IV SOLN
4.0000 mg | Freq: Once | INTRAVENOUS | Status: AC
  Administered 2017-10-25: 4 mg via INTRAVENOUS
  Filled 2017-10-25: qty 1

## 2017-10-25 MED ORDER — ACETAMINOPHEN 500 MG PO TABS
1000.0000 mg | ORAL_TABLET | Freq: Once | ORAL | Status: AC
  Administered 2017-10-25: 1000 mg via ORAL
  Filled 2017-10-25: qty 2

## 2017-10-25 MED ORDER — IOPAMIDOL (ISOVUE-300) INJECTION 61%
INTRAVENOUS | Status: AC
  Administered 2017-10-25: 75 mL
  Filled 2017-10-25: qty 75

## 2017-10-25 MED ORDER — HYDROCODONE-ACETAMINOPHEN 5-325 MG PO TABS: 1.0000 | ORAL_TABLET | ORAL | 0 refills | Status: AC | PRN

## 2017-10-25 NOTE — ED Notes (Signed)
Incentive spirometer at bedside. Kathlene NovemberMike RN to provide teaching on how to use spirometer as pt requested to use the restroom prior to this RN teaching her how to use.

## 2017-10-25 NOTE — ED Notes (Signed)
Pt c/o lateral lower left leg pain. EDP notified.

## 2017-10-25 NOTE — ED Notes (Signed)
Pt ambulated to restroom and back with minimal assistance. This tech assisted her on her left side while ambulating, but Pt stood up and sat down without any assistance. Her gait was somewhat abnormal (weakness/injury to left leg?) but good balance. Pt complained loudly of pain.
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
## 2017-10-25 NOTE — Progress Notes (Signed)
Orthopedic Tech Progress Note Patient Details:  Audrey Burke August 03, 1932 [id number]  Ortho Devices Type of Ortho Device: Ace wrap, Volar splint Ortho Device/Splint Location: RUE Ortho Device/Splint Interventions: Ordered, Application   Post Interventions Patient Tolerated: Well Instructions Provided: Care of device   Jennye MoccasinHughes, [name] 10/25/2017, 8:32 PM

## 2017-10-25 NOTE — ED Notes (Signed)
Ortho tech/ANTHONY paged for R hand/arm splint
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
## 2017-10-26 ENCOUNTER — Telehealth: Payer: Self-pay | Admitting: *Deleted

## 2017-10-26 NOTE — ED Notes (Signed)
Pt verbalizes understanding of d/c instructions. Pt received prescriptions. Pt taken to lobby in wheelchair at d/c with all belongings.   

## 2017-10-26 NOTE — Telephone Encounter (Signed)
Lonya Johannesen J. Treanna Dumler, RN, BSN, NCM [phone] Spoke with pt at bedside regarding discharge planning for Home Health Services. Offered pt list of home health agencies to choose from.  Pt chose [facility] to render services. [facility] notified. Patient made aware that [facility] will be in contact in 24-48 hours.  No DME needs identified at this time.  

## 2019-10-27 ENCOUNTER — Ambulatory Visit: Payer: Medicare Other | Attending: Internal Medicine

## 2019-10-27 DIAGNOSIS — Z23 Encounter for immunization: Secondary | ICD-10-CM | POA: Insufficient documentation

## 2019-10-27 NOTE — Progress Notes (Signed)
   Covid-19 Vaccination Clinic  Name:  Audrey Burke    MRN: [id number] DOB: October 10, 1931  10/27/2019  [name] was observed post Covid-19 immunization for 15 minutes without incidence. She was provided with Vaccine Information Sheet and instruction to access the V-Safe system.   [name] was instructed to call 911 with any severe reactions post vaccine: Marland Kitchen Difficulty breathing  . Swelling of your face and throat  . A fast heartbeat  . A bad rash all over your body  . Dizziness and weakness    Immunizations Administered    Name Date Dose VIS Date Route   Pfizer COVID-19 Vaccine 10/27/2019  2:57 PM 0.3 mL 08/09/2019 Intramuscular   Manufacturer: [employer], [employer]   Lot: [id number]   NDC: [address]-2

## 2019-11-26 ENCOUNTER — Ambulatory Visit: Payer: Medicare Other | Attending: Internal Medicine

## 2019-11-26 ENCOUNTER — Ambulatory Visit: Payer: Medicare Other

## 2019-11-26 DIAGNOSIS — Z23 Encounter for immunization: Secondary | ICD-10-CM

## 2019-11-26 NOTE — Progress Notes (Signed)
   Covid-19 Vaccination Clinic  Name:  Audrey Burke    MRN: [id number] DOB: [birth year]  11/26/2019  [name] was observed post Covid-19 immunization for 15 minutes without incident. She was provided with Vaccine Information Sheet and instruction to access the V-Safe system.   [name] was instructed to call 911 with any severe reactions post vaccine: Marland Kitchen Difficulty breathing  . Swelling of face and throat  . A fast heartbeat  . A bad rash all over body  . Dizziness and weakness   Immunizations Administered    Name Date Dose VIS Date Route   Pfizer COVID-19 Vaccine 11/26/2019  2:59 PM 0.3 mL 08/09/2019 Intramuscular   Manufacturer: [employer], [employer]   Lot: [id number]   NDC: [address]-9

## 2020-09-16 DIAGNOSIS — E871 Hypo-osmolality and hyponatremia: Secondary | ICD-10-CM | POA: Diagnosis not present

## 2020-09-16 DIAGNOSIS — Z0000 Encounter for general adult medical examination without abnormal findings: Secondary | ICD-10-CM | POA: Diagnosis not present

## 2020-09-16 DIAGNOSIS — Z8639 Personal history of other endocrine, nutritional and metabolic disease: Secondary | ICD-10-CM | POA: Diagnosis not present

## 2020-09-16 DIAGNOSIS — E538 Deficiency of other specified B group vitamins: Secondary | ICD-10-CM | POA: Diagnosis not present

## 2020-09-16 DIAGNOSIS — I700 Atherosclerosis of aorta: Secondary | ICD-10-CM | POA: Diagnosis not present

## 2020-09-16 DIAGNOSIS — I482 Chronic atrial fibrillation, unspecified: Secondary | ICD-10-CM | POA: Diagnosis not present

## 2020-09-16 DIAGNOSIS — K219 Gastro-esophageal reflux disease without esophagitis: Secondary | ICD-10-CM | POA: Diagnosis not present

## 2020-09-16 DIAGNOSIS — N182 Chronic kidney disease, stage 2 (mild): Secondary | ICD-10-CM | POA: Diagnosis not present

## 2020-09-16 DIAGNOSIS — M1990 Unspecified osteoarthritis, unspecified site: Secondary | ICD-10-CM | POA: Diagnosis not present

## 2020-09-16 DIAGNOSIS — I10 Essential (primary) hypertension: Secondary | ICD-10-CM | POA: Diagnosis not present

## 2020-09-16 DIAGNOSIS — E1122 Type 2 diabetes mellitus with diabetic chronic kidney disease: Secondary | ICD-10-CM | POA: Diagnosis not present

## 2020-09-16 DIAGNOSIS — E782 Mixed hyperlipidemia: Secondary | ICD-10-CM | POA: Diagnosis not present

## 2020-10-08 DIAGNOSIS — H47292 Other optic atrophy, left eye: Secondary | ICD-10-CM | POA: Diagnosis not present

## 2020-10-08 DIAGNOSIS — H524 Presbyopia: Secondary | ICD-10-CM | POA: Diagnosis not present

## 2020-10-08 DIAGNOSIS — H52203 Unspecified astigmatism, bilateral: Secondary | ICD-10-CM | POA: Diagnosis not present

## 2020-10-08 DIAGNOSIS — Z961 Presence of intraocular lens: Secondary | ICD-10-CM | POA: Diagnosis not present

## 2020-10-26 DIAGNOSIS — R7989 Other specified abnormal findings of blood chemistry: Secondary | ICD-10-CM | POA: Diagnosis not present

## 2020-11-26 DIAGNOSIS — D510 Vitamin B12 deficiency anemia due to intrinsic factor deficiency: Secondary | ICD-10-CM | POA: Diagnosis not present

## 2020-12-01 DIAGNOSIS — M25561 Pain in right knee: Secondary | ICD-10-CM | POA: Diagnosis not present

## 2020-12-28 DIAGNOSIS — D510 Vitamin B12 deficiency anemia due to intrinsic factor deficiency: Secondary | ICD-10-CM | POA: Diagnosis not present

## 2021-01-26 ENCOUNTER — Other Ambulatory Visit: Payer: Self-pay | Admitting: Physician Assistant

## 2021-01-26 ENCOUNTER — Ambulatory Visit
Admission: RE | Admit: 2021-01-26 | Discharge: 2021-01-26 | Disposition: A | Discharge status: Home / Self Care | Payer: Medicare Other | Source: Ambulatory Visit | Attending: Physician Assistant | Admitting: Physician Assistant

## 2021-01-26 DIAGNOSIS — M545 Low back pain, unspecified: Secondary | ICD-10-CM | POA: Diagnosis not present

## 2021-01-26 DIAGNOSIS — M25552 Pain in left hip: Secondary | ICD-10-CM

## 2021-01-26 DIAGNOSIS — M25559 Pain in unspecified hip: Secondary | ICD-10-CM | POA: Diagnosis not present

## 2021-01-26 DIAGNOSIS — M79605 Pain in left leg: Secondary | ICD-10-CM | POA: Diagnosis not present

## 2021-01-26 DIAGNOSIS — M1612 Unilateral primary osteoarthritis, left hip: Secondary | ICD-10-CM | POA: Diagnosis not present

## 2021-01-26 DIAGNOSIS — M47816 Spondylosis without myelopathy or radiculopathy, lumbar region: Secondary | ICD-10-CM | POA: Diagnosis not present

## 2021-02-04 DIAGNOSIS — S22000A Wedge compression fracture of unspecified thoracic vertebra, initial encounter for closed fracture: Secondary | ICD-10-CM | POA: Diagnosis not present

## 2021-02-04 DIAGNOSIS — M439 Deforming dorsopathy, unspecified: Secondary | ICD-10-CM | POA: Diagnosis not present

## 2021-02-04 DIAGNOSIS — I10 Essential (primary) hypertension: Secondary | ICD-10-CM | POA: Diagnosis not present

## 2021-02-26 DIAGNOSIS — D510 Vitamin B12 deficiency anemia due to intrinsic factor deficiency: Secondary | ICD-10-CM | POA: Diagnosis not present

## 2021-03-16 DIAGNOSIS — E782 Mixed hyperlipidemia: Secondary | ICD-10-CM | POA: Diagnosis not present

## 2021-03-16 DIAGNOSIS — I10 Essential (primary) hypertension: Secondary | ICD-10-CM | POA: Diagnosis not present

## 2021-03-16 DIAGNOSIS — I700 Atherosclerosis of aorta: Secondary | ICD-10-CM | POA: Diagnosis not present

## 2021-03-16 DIAGNOSIS — E1122 Type 2 diabetes mellitus with diabetic chronic kidney disease: Secondary | ICD-10-CM | POA: Diagnosis not present

## 2021-03-16 DIAGNOSIS — D510 Vitamin B12 deficiency anemia due to intrinsic factor deficiency: Secondary | ICD-10-CM | POA: Diagnosis not present

## 2021-03-16 DIAGNOSIS — N182 Chronic kidney disease, stage 2 (mild): Secondary | ICD-10-CM | POA: Diagnosis not present

## 2021-03-16 DIAGNOSIS — D6869 Other thrombophilia: Secondary | ICD-10-CM | POA: Diagnosis not present

## 2021-03-16 DIAGNOSIS — E0590 Thyrotoxicosis, unspecified without thyrotoxic crisis or storm: Secondary | ICD-10-CM | POA: Diagnosis not present

## 2021-03-16 DIAGNOSIS — M549 Dorsalgia, unspecified: Secondary | ICD-10-CM | POA: Diagnosis not present

## 2021-03-16 DIAGNOSIS — I482 Chronic atrial fibrillation, unspecified: Secondary | ICD-10-CM | POA: Diagnosis not present

## 2021-03-31 DIAGNOSIS — D510 Vitamin B12 deficiency anemia due to intrinsic factor deficiency: Secondary | ICD-10-CM | POA: Diagnosis not present

## 2021-03-31 DIAGNOSIS — I10 Essential (primary) hypertension: Secondary | ICD-10-CM | POA: Diagnosis not present

## 2021-05-04 DIAGNOSIS — D510 Vitamin B12 deficiency anemia due to intrinsic factor deficiency: Secondary | ICD-10-CM | POA: Diagnosis not present

## 2021-05-24 DIAGNOSIS — R0981 Nasal congestion: Secondary | ICD-10-CM | POA: Diagnosis not present

## 2021-05-24 DIAGNOSIS — H6121 Impacted cerumen, right ear: Secondary | ICD-10-CM | POA: Diagnosis not present

## 2021-06-04 DIAGNOSIS — R030 Elevated blood-pressure reading, without diagnosis of hypertension: Secondary | ICD-10-CM | POA: Diagnosis not present

## 2021-06-04 DIAGNOSIS — D510 Vitamin B12 deficiency anemia due to intrinsic factor deficiency: Secondary | ICD-10-CM | POA: Diagnosis not present

## 2021-06-04 DIAGNOSIS — H6121 Impacted cerumen, right ear: Secondary | ICD-10-CM | POA: Diagnosis not present

## 2021-06-16 DIAGNOSIS — Z4689 Encounter for fitting and adjustment of other specified devices: Secondary | ICD-10-CM | POA: Diagnosis not present

## 2021-07-05 DIAGNOSIS — D510 Vitamin B12 deficiency anemia due to intrinsic factor deficiency: Secondary | ICD-10-CM | POA: Diagnosis not present

## 2021-07-05 DIAGNOSIS — Z23 Encounter for immunization: Secondary | ICD-10-CM | POA: Diagnosis not present

## 2021-07-19 DIAGNOSIS — Z4689 Encounter for fitting and adjustment of other specified devices: Secondary | ICD-10-CM | POA: Diagnosis not present

## 2021-07-30 DIAGNOSIS — M25511 Pain in right shoulder: Secondary | ICD-10-CM | POA: Diagnosis not present

## 2021-08-05 DIAGNOSIS — D510 Vitamin B12 deficiency anemia due to intrinsic factor deficiency: Secondary | ICD-10-CM | POA: Diagnosis not present

## 2021-08-18 ENCOUNTER — Other Ambulatory Visit: Payer: Self-pay

## 2021-08-18 ENCOUNTER — Other Ambulatory Visit: Payer: Self-pay | Admitting: Physician Assistant

## 2021-08-18 ENCOUNTER — Ambulatory Visit
Admission: RE | Admit: 2021-08-18 | Discharge: 2021-08-18 | Disposition: A | Discharge status: Home / Self Care | Payer: Medicare Other | Source: Ambulatory Visit | Attending: Physician Assistant | Admitting: Physician Assistant

## 2021-08-18 DIAGNOSIS — M25511 Pain in right shoulder: Secondary | ICD-10-CM

## 2021-08-27 DIAGNOSIS — D510 Vitamin B12 deficiency anemia due to intrinsic factor deficiency: Secondary | ICD-10-CM | POA: Diagnosis not present

## 2021-09-09 DIAGNOSIS — M1711 Unilateral primary osteoarthritis, right knee: Secondary | ICD-10-CM | POA: Diagnosis not present

## 2021-09-20 DIAGNOSIS — E1122 Type 2 diabetes mellitus with diabetic chronic kidney disease: Secondary | ICD-10-CM | POA: Diagnosis not present

## 2021-09-20 DIAGNOSIS — Z0000 Encounter for general adult medical examination without abnormal findings: Secondary | ICD-10-CM | POA: Diagnosis not present

## 2021-09-20 DIAGNOSIS — I482 Chronic atrial fibrillation, unspecified: Secondary | ICD-10-CM | POA: Diagnosis not present

## 2021-09-20 DIAGNOSIS — D510 Vitamin B12 deficiency anemia due to intrinsic factor deficiency: Secondary | ICD-10-CM | POA: Diagnosis not present

## 2021-09-20 DIAGNOSIS — I700 Atherosclerosis of aorta: Secondary | ICD-10-CM | POA: Diagnosis not present

## 2021-09-20 DIAGNOSIS — I10 Essential (primary) hypertension: Secondary | ICD-10-CM | POA: Diagnosis not present

## 2021-09-20 DIAGNOSIS — E782 Mixed hyperlipidemia: Secondary | ICD-10-CM | POA: Diagnosis not present

## 2021-09-20 DIAGNOSIS — E0590 Thyrotoxicosis, unspecified without thyrotoxic crisis or storm: Secondary | ICD-10-CM | POA: Diagnosis not present

## 2021-09-20 DIAGNOSIS — E871 Hypo-osmolality and hyponatremia: Secondary | ICD-10-CM | POA: Diagnosis not present

## 2021-09-20 DIAGNOSIS — N182 Chronic kidney disease, stage 2 (mild): Secondary | ICD-10-CM | POA: Diagnosis not present

## 2021-09-20 DIAGNOSIS — D6869 Other thrombophilia: Secondary | ICD-10-CM | POA: Diagnosis not present

## 2021-09-28 DIAGNOSIS — U071 COVID-19: Secondary | ICD-10-CM | POA: Diagnosis not present

## 2021-09-28 DIAGNOSIS — D510 Vitamin B12 deficiency anemia due to intrinsic factor deficiency: Secondary | ICD-10-CM | POA: Diagnosis not present

## 2021-09-28 DIAGNOSIS — R051 Acute cough: Secondary | ICD-10-CM | POA: Diagnosis not present

## 2021-10-29 DIAGNOSIS — D510 Vitamin B12 deficiency anemia due to intrinsic factor deficiency: Secondary | ICD-10-CM | POA: Diagnosis not present

## 2021-11-02 DIAGNOSIS — I10 Essential (primary) hypertension: Secondary | ICD-10-CM | POA: Diagnosis not present

## 2021-11-02 DIAGNOSIS — R350 Frequency of micturition: Secondary | ICD-10-CM | POA: Diagnosis not present

## 2021-11-30 DIAGNOSIS — D510 Vitamin B12 deficiency anemia due to intrinsic factor deficiency: Secondary | ICD-10-CM | POA: Diagnosis not present

## 2021-12-22 DIAGNOSIS — D6869 Other thrombophilia: Secondary | ICD-10-CM | POA: Diagnosis not present

## 2021-12-22 DIAGNOSIS — E0590 Thyrotoxicosis, unspecified without thyrotoxic crisis or storm: Secondary | ICD-10-CM | POA: Diagnosis not present

## 2021-12-22 DIAGNOSIS — I10 Essential (primary) hypertension: Secondary | ICD-10-CM | POA: Diagnosis not present

## 2021-12-22 DIAGNOSIS — E871 Hypo-osmolality and hyponatremia: Secondary | ICD-10-CM | POA: Diagnosis not present

## 2021-12-22 DIAGNOSIS — I482 Chronic atrial fibrillation, unspecified: Secondary | ICD-10-CM | POA: Diagnosis not present

## 2021-12-22 DIAGNOSIS — N182 Chronic kidney disease, stage 2 (mild): Secondary | ICD-10-CM | POA: Diagnosis not present

## 2021-12-22 DIAGNOSIS — E1122 Type 2 diabetes mellitus with diabetic chronic kidney disease: Secondary | ICD-10-CM | POA: Diagnosis not present

## 2021-12-22 DIAGNOSIS — I700 Atherosclerosis of aorta: Secondary | ICD-10-CM | POA: Diagnosis not present

## 2021-12-22 DIAGNOSIS — D510 Vitamin B12 deficiency anemia due to intrinsic factor deficiency: Secondary | ICD-10-CM | POA: Diagnosis not present

## 2021-12-22 DIAGNOSIS — E782 Mixed hyperlipidemia: Secondary | ICD-10-CM | POA: Diagnosis not present

## 2021-12-31 DIAGNOSIS — D510 Vitamin B12 deficiency anemia due to intrinsic factor deficiency: Secondary | ICD-10-CM | POA: Diagnosis not present

## 2022-01-28 DIAGNOSIS — Z4689 Encounter for fitting and adjustment of other specified devices: Secondary | ICD-10-CM | POA: Diagnosis not present

## 2022-01-28 DIAGNOSIS — Z91199 Patient's noncompliance with other medical treatment and regimen due to unspecified reason: Secondary | ICD-10-CM | POA: Diagnosis not present

## 2022-01-31 DIAGNOSIS — D510 Vitamin B12 deficiency anemia due to intrinsic factor deficiency: Secondary | ICD-10-CM | POA: Diagnosis not present

## 2022-02-01 DIAGNOSIS — M1711 Unilateral primary osteoarthritis, right knee: Secondary | ICD-10-CM | POA: Diagnosis not present

## 2022-03-03 DIAGNOSIS — R0981 Nasal congestion: Secondary | ICD-10-CM | POA: Diagnosis not present

## 2022-03-03 DIAGNOSIS — D510 Vitamin B12 deficiency anemia due to intrinsic factor deficiency: Secondary | ICD-10-CM | POA: Diagnosis not present

## 2022-03-03 DIAGNOSIS — R058 Other specified cough: Secondary | ICD-10-CM | POA: Diagnosis not present

## 2022-03-11 DIAGNOSIS — R0981 Nasal congestion: Secondary | ICD-10-CM | POA: Diagnosis not present

## 2022-03-11 DIAGNOSIS — M791 Myalgia, unspecified site: Secondary | ICD-10-CM | POA: Diagnosis not present

## 2022-03-24 DIAGNOSIS — N1831 Chronic kidney disease, stage 3a: Secondary | ICD-10-CM | POA: Diagnosis not present

## 2022-03-24 DIAGNOSIS — D510 Vitamin B12 deficiency anemia due to intrinsic factor deficiency: Secondary | ICD-10-CM | POA: Diagnosis not present

## 2022-03-24 DIAGNOSIS — M1990 Unspecified osteoarthritis, unspecified site: Secondary | ICD-10-CM | POA: Diagnosis not present

## 2022-03-24 DIAGNOSIS — E1122 Type 2 diabetes mellitus with diabetic chronic kidney disease: Secondary | ICD-10-CM | POA: Diagnosis not present

## 2022-03-24 DIAGNOSIS — D6869 Other thrombophilia: Secondary | ICD-10-CM | POA: Diagnosis not present

## 2022-03-24 DIAGNOSIS — E538 Deficiency of other specified B group vitamins: Secondary | ICD-10-CM | POA: Diagnosis not present

## 2022-03-24 DIAGNOSIS — E0590 Thyrotoxicosis, unspecified without thyrotoxic crisis or storm: Secondary | ICD-10-CM | POA: Diagnosis not present

## 2022-03-24 DIAGNOSIS — I700 Atherosclerosis of aorta: Secondary | ICD-10-CM | POA: Diagnosis not present

## 2022-03-24 DIAGNOSIS — R0981 Nasal congestion: Secondary | ICD-10-CM | POA: Diagnosis not present

## 2022-03-24 DIAGNOSIS — I482 Chronic atrial fibrillation, unspecified: Secondary | ICD-10-CM | POA: Diagnosis not present

## 2022-03-24 DIAGNOSIS — E871 Hypo-osmolality and hyponatremia: Secondary | ICD-10-CM | POA: Diagnosis not present

## 2022-04-04 DIAGNOSIS — D510 Vitamin B12 deficiency anemia due to intrinsic factor deficiency: Secondary | ICD-10-CM | POA: Diagnosis not present

## 2022-04-13 DIAGNOSIS — Z91199 Patient's noncompliance with other medical treatment and regimen due to unspecified reason: Secondary | ICD-10-CM | POA: Diagnosis not present

## 2022-04-13 DIAGNOSIS — Z4689 Encounter for fitting and adjustment of other specified devices: Secondary | ICD-10-CM | POA: Diagnosis not present

## 2022-05-05 DIAGNOSIS — D510 Vitamin B12 deficiency anemia due to intrinsic factor deficiency: Secondary | ICD-10-CM | POA: Diagnosis not present

## 2022-06-06 DIAGNOSIS — D510 Vitamin B12 deficiency anemia due to intrinsic factor deficiency: Secondary | ICD-10-CM | POA: Diagnosis not present

## 2022-06-27 DIAGNOSIS — E782 Mixed hyperlipidemia: Secondary | ICD-10-CM | POA: Diagnosis not present

## 2022-06-27 DIAGNOSIS — E1122 Type 2 diabetes mellitus with diabetic chronic kidney disease: Secondary | ICD-10-CM | POA: Diagnosis not present

## 2022-06-27 DIAGNOSIS — Z23 Encounter for immunization: Secondary | ICD-10-CM | POA: Diagnosis not present

## 2022-06-27 DIAGNOSIS — N1831 Chronic kidney disease, stage 3a: Secondary | ICD-10-CM | POA: Diagnosis not present

## 2022-06-27 DIAGNOSIS — Z8639 Personal history of other endocrine, nutritional and metabolic disease: Secondary | ICD-10-CM | POA: Diagnosis not present

## 2022-06-27 DIAGNOSIS — I482 Chronic atrial fibrillation, unspecified: Secondary | ICD-10-CM | POA: Diagnosis not present

## 2022-06-27 DIAGNOSIS — R54 Age-related physical debility: Secondary | ICD-10-CM | POA: Diagnosis not present

## 2022-06-27 DIAGNOSIS — I10 Essential (primary) hypertension: Secondary | ICD-10-CM | POA: Diagnosis not present

## 2022-06-27 DIAGNOSIS — I700 Atherosclerosis of aorta: Secondary | ICD-10-CM | POA: Diagnosis not present

## 2022-06-27 DIAGNOSIS — E538 Deficiency of other specified B group vitamins: Secondary | ICD-10-CM | POA: Diagnosis not present

## 2022-06-27 DIAGNOSIS — D6869 Other thrombophilia: Secondary | ICD-10-CM | POA: Diagnosis not present

## 2022-07-07 DIAGNOSIS — D510 Vitamin B12 deficiency anemia due to intrinsic factor deficiency: Secondary | ICD-10-CM | POA: Diagnosis not present

## 2022-07-28 DIAGNOSIS — D510 Vitamin B12 deficiency anemia due to intrinsic factor deficiency: Secondary | ICD-10-CM | POA: Diagnosis not present

## 2022-08-16 DIAGNOSIS — Z789 Other specified health status: Secondary | ICD-10-CM | POA: Diagnosis not present

## 2022-08-16 DIAGNOSIS — Z91199 Patient's noncompliance with other medical treatment and regimen due to unspecified reason: Secondary | ICD-10-CM | POA: Diagnosis not present

## 2022-08-16 DIAGNOSIS — Z4689 Encounter for fitting and adjustment of other specified devices: Secondary | ICD-10-CM | POA: Diagnosis not present

## 2022-08-25 DIAGNOSIS — M1711 Unilateral primary osteoarthritis, right knee: Secondary | ICD-10-CM | POA: Diagnosis not present

## 2022-08-30 DIAGNOSIS — D510 Vitamin B12 deficiency anemia due to intrinsic factor deficiency: Secondary | ICD-10-CM | POA: Diagnosis not present

## 2022-09-21 DIAGNOSIS — E1122 Type 2 diabetes mellitus with diabetic chronic kidney disease: Secondary | ICD-10-CM | POA: Diagnosis not present

## 2022-09-21 DIAGNOSIS — D510 Vitamin B12 deficiency anemia due to intrinsic factor deficiency: Secondary | ICD-10-CM | POA: Diagnosis not present

## 2022-09-21 DIAGNOSIS — I700 Atherosclerosis of aorta: Secondary | ICD-10-CM | POA: Diagnosis not present

## 2022-09-21 DIAGNOSIS — E782 Mixed hyperlipidemia: Secondary | ICD-10-CM | POA: Diagnosis not present

## 2022-09-21 DIAGNOSIS — E871 Hypo-osmolality and hyponatremia: Secondary | ICD-10-CM | POA: Diagnosis not present

## 2022-09-21 DIAGNOSIS — Z0000 Encounter for general adult medical examination without abnormal findings: Secondary | ICD-10-CM | POA: Diagnosis not present

## 2022-09-21 DIAGNOSIS — I10 Essential (primary) hypertension: Secondary | ICD-10-CM | POA: Diagnosis not present

## 2022-09-21 DIAGNOSIS — N1831 Chronic kidney disease, stage 3a: Secondary | ICD-10-CM | POA: Diagnosis not present

## 2022-09-21 DIAGNOSIS — I482 Chronic atrial fibrillation, unspecified: Secondary | ICD-10-CM | POA: Diagnosis not present

## 2022-09-21 DIAGNOSIS — E0590 Thyrotoxicosis, unspecified without thyrotoxic crisis or storm: Secondary | ICD-10-CM | POA: Diagnosis not present

## 2022-09-21 DIAGNOSIS — D6869 Other thrombophilia: Secondary | ICD-10-CM | POA: Diagnosis not present

## 2022-10-24 DIAGNOSIS — D510 Vitamin B12 deficiency anemia due to intrinsic factor deficiency: Secondary | ICD-10-CM | POA: Diagnosis not present

## 2022-11-24 DIAGNOSIS — D510 Vitamin B12 deficiency anemia due to intrinsic factor deficiency: Secondary | ICD-10-CM | POA: Diagnosis not present

## 2022-12-26 DIAGNOSIS — D510 Vitamin B12 deficiency anemia due to intrinsic factor deficiency: Secondary | ICD-10-CM | POA: Diagnosis not present

## 2023-01-26 DIAGNOSIS — D510 Vitamin B12 deficiency anemia due to intrinsic factor deficiency: Secondary | ICD-10-CM | POA: Diagnosis not present

## 2023-03-22 DIAGNOSIS — D6869 Other thrombophilia: Secondary | ICD-10-CM | POA: Diagnosis not present

## 2023-03-22 DIAGNOSIS — D510 Vitamin B12 deficiency anemia due to intrinsic factor deficiency: Secondary | ICD-10-CM | POA: Diagnosis not present

## 2023-03-22 DIAGNOSIS — E871 Hypo-osmolality and hyponatremia: Secondary | ICD-10-CM | POA: Diagnosis not present

## 2023-03-22 DIAGNOSIS — E782 Mixed hyperlipidemia: Secondary | ICD-10-CM | POA: Diagnosis not present

## 2023-03-22 DIAGNOSIS — I5032 Chronic diastolic (congestive) heart failure: Secondary | ICD-10-CM | POA: Diagnosis not present

## 2023-03-22 DIAGNOSIS — E1122 Type 2 diabetes mellitus with diabetic chronic kidney disease: Secondary | ICD-10-CM | POA: Diagnosis not present

## 2023-03-22 DIAGNOSIS — E0590 Thyrotoxicosis, unspecified without thyrotoxic crisis or storm: Secondary | ICD-10-CM | POA: Diagnosis not present

## 2023-03-22 DIAGNOSIS — I700 Atherosclerosis of aorta: Secondary | ICD-10-CM | POA: Diagnosis not present

## 2023-03-22 DIAGNOSIS — I4821 Permanent atrial fibrillation: Secondary | ICD-10-CM | POA: Diagnosis not present

## 2023-03-22 DIAGNOSIS — N1831 Chronic kidney disease, stage 3a: Secondary | ICD-10-CM | POA: Diagnosis not present

## 2023-03-22 DIAGNOSIS — I10 Essential (primary) hypertension: Secondary | ICD-10-CM | POA: Diagnosis not present

## 2023-04-24 DIAGNOSIS — D510 Vitamin B12 deficiency anemia due to intrinsic factor deficiency: Secondary | ICD-10-CM | POA: Diagnosis not present

## 2023-05-23 DIAGNOSIS — J069 Acute upper respiratory infection, unspecified: Secondary | ICD-10-CM | POA: Diagnosis not present

## 2023-05-23 DIAGNOSIS — Z03818 Encounter for observation for suspected exposure to other biological agents ruled out: Secondary | ICD-10-CM | POA: Diagnosis not present

## 2023-06-01 DIAGNOSIS — M1711 Unilateral primary osteoarthritis, right knee: Secondary | ICD-10-CM | POA: Diagnosis not present

## 2023-06-01 DIAGNOSIS — M1712 Unilateral primary osteoarthritis, left knee: Secondary | ICD-10-CM | POA: Diagnosis not present

## 2023-06-22 DIAGNOSIS — I5032 Chronic diastolic (congestive) heart failure: Secondary | ICD-10-CM | POA: Diagnosis not present

## 2023-06-22 DIAGNOSIS — E538 Deficiency of other specified B group vitamins: Secondary | ICD-10-CM | POA: Diagnosis not present

## 2023-06-22 DIAGNOSIS — D6869 Other thrombophilia: Secondary | ICD-10-CM | POA: Diagnosis not present

## 2023-06-22 DIAGNOSIS — I700 Atherosclerosis of aorta: Secondary | ICD-10-CM | POA: Diagnosis not present

## 2023-06-22 DIAGNOSIS — E0590 Thyrotoxicosis, unspecified without thyrotoxic crisis or storm: Secondary | ICD-10-CM | POA: Diagnosis not present

## 2023-06-22 DIAGNOSIS — M8588 Other specified disorders of bone density and structure, other site: Secondary | ICD-10-CM | POA: Diagnosis not present

## 2023-06-22 DIAGNOSIS — E1122 Type 2 diabetes mellitus with diabetic chronic kidney disease: Secondary | ICD-10-CM | POA: Diagnosis not present

## 2023-06-22 DIAGNOSIS — E782 Mixed hyperlipidemia: Secondary | ICD-10-CM | POA: Diagnosis not present

## 2023-06-22 DIAGNOSIS — Z23 Encounter for immunization: Secondary | ICD-10-CM | POA: Diagnosis not present

## 2023-06-22 DIAGNOSIS — N1831 Chronic kidney disease, stage 3a: Secondary | ICD-10-CM | POA: Diagnosis not present

## 2023-06-22 DIAGNOSIS — I130 Hypertensive heart and chronic kidney disease with heart failure and stage 1 through stage 4 chronic kidney disease, or unspecified chronic kidney disease: Secondary | ICD-10-CM | POA: Diagnosis not present

## 2023-06-29 DIAGNOSIS — M1711 Unilateral primary osteoarthritis, right knee: Secondary | ICD-10-CM | POA: Diagnosis not present

## 2023-07-06 DIAGNOSIS — M1711 Unilateral primary osteoarthritis, right knee: Secondary | ICD-10-CM | POA: Diagnosis not present

## 2023-07-13 DIAGNOSIS — M1711 Unilateral primary osteoarthritis, right knee: Secondary | ICD-10-CM | POA: Diagnosis not present

## 2023-07-24 DIAGNOSIS — E1122 Type 2 diabetes mellitus with diabetic chronic kidney disease: Secondary | ICD-10-CM | POA: Diagnosis not present

## 2023-09-26 DIAGNOSIS — Z8639 Personal history of other endocrine, nutritional and metabolic disease: Secondary | ICD-10-CM | POA: Diagnosis not present

## 2023-09-26 DIAGNOSIS — Z23 Encounter for immunization: Secondary | ICD-10-CM | POA: Diagnosis not present

## 2023-09-26 DIAGNOSIS — I5032 Chronic diastolic (congestive) heart failure: Secondary | ICD-10-CM | POA: Diagnosis not present

## 2023-09-26 DIAGNOSIS — N1831 Chronic kidney disease, stage 3a: Secondary | ICD-10-CM | POA: Diagnosis not present

## 2023-09-26 DIAGNOSIS — I10 Essential (primary) hypertension: Secondary | ICD-10-CM | POA: Diagnosis not present

## 2023-09-26 DIAGNOSIS — D6869 Other thrombophilia: Secondary | ICD-10-CM | POA: Diagnosis not present

## 2023-09-26 DIAGNOSIS — E782 Mixed hyperlipidemia: Secondary | ICD-10-CM | POA: Diagnosis not present

## 2023-09-26 DIAGNOSIS — I4821 Permanent atrial fibrillation: Secondary | ICD-10-CM | POA: Diagnosis not present

## 2023-09-26 DIAGNOSIS — I700 Atherosclerosis of aorta: Secondary | ICD-10-CM | POA: Diagnosis not present

## 2023-09-26 DIAGNOSIS — E1122 Type 2 diabetes mellitus with diabetic chronic kidney disease: Secondary | ICD-10-CM | POA: Diagnosis not present

## 2023-09-26 DIAGNOSIS — D510 Vitamin B12 deficiency anemia due to intrinsic factor deficiency: Secondary | ICD-10-CM | POA: Diagnosis not present

## 2023-09-26 DIAGNOSIS — Z0000 Encounter for general adult medical examination without abnormal findings: Secondary | ICD-10-CM | POA: Diagnosis not present

## 2023-10-27 DIAGNOSIS — D510 Vitamin B12 deficiency anemia due to intrinsic factor deficiency: Secondary | ICD-10-CM | POA: Diagnosis not present

## 2023-11-27 DIAGNOSIS — D510 Vitamin B12 deficiency anemia due to intrinsic factor deficiency: Secondary | ICD-10-CM | POA: Diagnosis not present

## 2023-12-28 DIAGNOSIS — D510 Vitamin B12 deficiency anemia due to intrinsic factor deficiency: Secondary | ICD-10-CM | POA: Diagnosis not present

## 2024-01-29 DIAGNOSIS — D510 Vitamin B12 deficiency anemia due to intrinsic factor deficiency: Secondary | ICD-10-CM | POA: Diagnosis not present

## 2024-03-26 DIAGNOSIS — I10 Essential (primary) hypertension: Secondary | ICD-10-CM | POA: Diagnosis not present

## 2024-03-26 DIAGNOSIS — M1990 Unspecified osteoarthritis, unspecified site: Secondary | ICD-10-CM | POA: Diagnosis not present

## 2024-03-26 DIAGNOSIS — E782 Mixed hyperlipidemia: Secondary | ICD-10-CM | POA: Diagnosis not present

## 2024-03-26 DIAGNOSIS — E538 Deficiency of other specified B group vitamins: Secondary | ICD-10-CM | POA: Diagnosis not present

## 2024-03-26 DIAGNOSIS — N1831 Chronic kidney disease, stage 3a: Secondary | ICD-10-CM | POA: Diagnosis not present

## 2024-03-26 DIAGNOSIS — E1122 Type 2 diabetes mellitus with diabetic chronic kidney disease: Secondary | ICD-10-CM | POA: Diagnosis not present

## 2024-03-26 DIAGNOSIS — I700 Atherosclerosis of aorta: Secondary | ICD-10-CM | POA: Diagnosis not present

## 2024-03-26 DIAGNOSIS — I4821 Permanent atrial fibrillation: Secondary | ICD-10-CM | POA: Diagnosis not present

## 2024-03-26 DIAGNOSIS — I5032 Chronic diastolic (congestive) heart failure: Secondary | ICD-10-CM | POA: Diagnosis not present

## 2024-03-26 DIAGNOSIS — E0590 Thyrotoxicosis, unspecified without thyrotoxic crisis or storm: Secondary | ICD-10-CM | POA: Diagnosis not present

## 2024-03-26 DIAGNOSIS — Z8639 Personal history of other endocrine, nutritional and metabolic disease: Secondary | ICD-10-CM | POA: Diagnosis not present

## 2024-04-26 DIAGNOSIS — D510 Vitamin B12 deficiency anemia due to intrinsic factor deficiency: Secondary | ICD-10-CM | POA: Diagnosis not present
